# Patient Record
Sex: Male | Born: 1975 | Race: White | Hispanic: Yes | Marital: Single | Smoking: Never smoker
Health system: Southern US, Community
[De-identification: ages and names within clinical notes are randomized; demographics above are authoritative.]

## PROBLEM LIST (undated history)

## (undated) HISTORY — PX: FOOT SURGERY: SHX648

---

## 2014-09-10 ENCOUNTER — Ambulatory Visit (INDEPENDENT_AMBULATORY_CARE_PROVIDER_SITE_OTHER): Payer: Self-pay | Admitting: Surgery

## 2014-10-04 ENCOUNTER — Ambulatory Visit (INDEPENDENT_AMBULATORY_CARE_PROVIDER_SITE_OTHER): Payer: Self-pay | Admitting: Surgery

## 2015-02-10 ENCOUNTER — Encounter (HOSPITAL_COMMUNITY): Payer: Self-pay

## 2015-02-10 ENCOUNTER — Emergency Department (HOSPITAL_COMMUNITY)
Admission: EM | Admit: 2015-02-10 | Discharge: 2015-02-10 | Disposition: A | Discharge status: Home / Self Care | Payer: Self-pay | Attending: Emergency Medicine | Admitting: Emergency Medicine

## 2015-02-10 DIAGNOSIS — K645 Perianal venous thrombosis: Secondary | ICD-10-CM | POA: Insufficient documentation

## 2015-02-10 DIAGNOSIS — K644 Residual hemorrhoidal skin tags: Secondary | ICD-10-CM

## 2015-02-10 LAB — COMPREHENSIVE METABOLIC PANEL
ALT: 19 U/L (ref 0–53)
AST: 24 U/L (ref 0–37)
Albumin: 4.2 g/dL (ref 3.5–5.2)
Alkaline Phosphatase: 101 U/L (ref 39–117)
Anion gap: 4 — ABNORMAL LOW (ref 5–15)
BILIRUBIN TOTAL: 0.9 mg/dL (ref 0.3–1.2)
BUN: 15 mg/dL (ref 6–23)
CO2: 27 mmol/L (ref 19–32)
CREATININE: 1.01 mg/dL (ref 0.50–1.35)
Calcium: 8.8 mg/dL (ref 8.4–10.5)
Chloride: 106 mmol/L (ref 96–112)
GFR calc Af Amer: >90 mL/min (ref >90)
GFR calc non Af Amer: >90 mL/min (ref >90)
Glucose, Bld: 110 mg/dL — ABNORMAL HIGH (ref 70–99)
POTASSIUM: 3.4 mmol/L — AB (ref 3.5–5.1)
Sodium: 137 mmol/L (ref 135–145)
Total Protein: 7.3 g/dL (ref 6.0–8.3)

## 2015-02-10 LAB — CBC
HCT: 44.4 % (ref 39.0–52.0)
HEMOGLOBIN: 15.1 g/dL (ref 13.0–17.0)
MCH: 29.2 pg (ref 26.0–34.0)
MCHC: 34 g/dL (ref 30.0–36.0)
MCV: 85.9 fL (ref 78.0–100.0)
Platelets: 270 10*3/uL (ref 150–400)
RBC: 5.17 MIL/uL (ref 4.22–5.81)
RDW: 12.6 % (ref 11.5–15.5)
WBC: 6.8 10*3/uL (ref 4.0–10.5)

## 2015-02-10 LAB — LIPASE, BLOOD: LIPASE: 29 U/L (ref 11–59)

## 2015-02-10 MED ORDER — DOCUSATE SODIUM 100 MG PO CAPS: 100.0000 mg | ORAL_CAPSULE | Freq: Two times a day (BID) | ORAL | Status: DC

## 2015-02-10 MED ORDER — HYDROCORTISONE 2.5 % RE CREA: TOPICAL_CREAM | RECTAL | Status: DC

## 2015-02-10 MED ORDER — POLYETHYLENE GLYCOL 3350 17 GM/SCOOP PO POWD: 17.0000 g | Freq: Every day | ORAL | Status: DC

## 2015-02-10 NOTE — ED Provider Notes (Signed)
CSN: [id number]     Arrival date & time 02/10/15  2145 History   First MD Initiated Contact with Patient 02/10/15 2203     Chief Complaint  Patient presents with  . Hemorrhoids  . Abdominal Pain     (Consider location/radiation/quality/duration/timing/severity/associated sxs/prior Treatment) HPI Comments: Patient presents today with a chief complaint of hemorrhoids.  He states that hemorrhoids have been present for the past year, but became more painful a few days ago.  He states that he has seen a Surgeon in the past for the hemorrhoids, but has not had surgery.  He reports associated constipation and states that he has to strain with bowel movements.  He reports occasional bright red blood with bowel movements, but no rectal bleeding in the past week.  He states that he has used hemorrhoid creams in the past and has taken a powder.  He is unsure of the names of the medications.  He reports that he had a burning abdominal pain a few days ago, but denies abdominal pain at this time.  He denies fever, chills, nausea, vomiting, or diarrhea.    The history is provided by the patient. The history is limited by a language barrier. A language interpreter was used Furniture conservator/restorer(Phone interpretor used).    History reviewed. No pertinent past medical history. History reviewed. No pertinent past surgical history. History reviewed. No pertinent family history. History  Substance Use Topics  . Smoking status: Never Smoker   . Smokeless tobacco: Not on file  . Alcohol Use: No    Review of Systems  All other systems reviewed and are negative.     Allergies  Review of patient's allergies indicates not on file.  Home Medications   Prior to Admission medications   Not on File   BP 112/75 mmHg  Pulse 70  Temp(Src) 97.8 F (36.6 C) (Oral)  Resp 20  SpO2 99% Physical Exam  Constitutional: He appears well-developed and well-nourished.  HENT:  Head: Normocephalic and atraumatic.  Mouth/Throat:  Oropharynx is clear and moist.  Neck: Normal range of motion. Neck supple.  Cardiovascular: Normal rate, regular rhythm and normal heart sounds.   Pulmonary/Chest: Effort normal and breath sounds normal.  Abdominal: Soft. Bowel sounds are normal. He exhibits no distension and no mass. There is no tenderness. There is no rebound and no guarding.  Genitourinary: Rectal exam shows external hemorrhoid.  One non thrombosed external hemorrhoids.  One very small thrombosed external hemorrhoid.  No surrounding erythema, edema, or warmth.  No gross rectal bleeding.  Musculoskeletal: Normal range of motion.  Neurological: He is alert.  Skin: Skin is warm and dry.  Psychiatric: He has a normal mood and affect.  Nursing note and vitals reviewed.   ED Course  Procedures (including critical care time) Labs Review Labs Reviewed  CBC  COMPREHENSIVE METABOLIC PANEL  LIPASE, BLOOD    Imaging Review No results found.   EKG Interpretation None      MDM   Final diagnoses:  None   Patient presents today with a chief complaint of hemorrhoids.  He reports seeing several providers for this in the past including Surgery.  On exam, he has External Hemorrhoids.  No significantly thrombosed hemorrhoid at this time.  Abdomen is soft and non tender.  Feel that the patient is stable for discharge.  Return precautions given.      Santiago GladHeather Olander Friedl, PA-C 02/10/15 2353  Joya Gaskinsonald W Wickline, MD 02/11/15 [phone]

## 2015-02-10 NOTE — ED Notes (Signed)
Pt also reporting abdominal pain. Sts it is burning in nature.  Sts he normally has to drink cold water to make the burning stop.

## 2015-02-10 NOTE — ED Notes (Signed)
Assessment done with translator phone.

## 2015-02-10 NOTE — ED Notes (Signed)
Pt reporting hemorroids.  Sts they are started to hurt him.  Denies any rectal bleeding.  Also reporting back pain.

## 2017-08-30 ENCOUNTER — Encounter (HOSPITAL_COMMUNITY): Payer: Self-pay | Admitting: Emergency Medicine

## 2017-08-30 ENCOUNTER — Encounter: Payer: Self-pay | Admitting: Family Medicine

## 2017-08-30 ENCOUNTER — Ambulatory Visit (INDEPENDENT_AMBULATORY_CARE_PROVIDER_SITE_OTHER): Payer: Self-pay | Admitting: Family Medicine

## 2017-08-30 VITALS — BP 130/85 | HR 75 | Temp 98.2°F | Resp 18 | Wt 191.2 lb

## 2017-08-30 DIAGNOSIS — M545 Low back pain, unspecified: Secondary | ICD-10-CM

## 2017-08-30 DIAGNOSIS — R1031 Right lower quadrant pain: Secondary | ICD-10-CM

## 2017-08-30 DIAGNOSIS — D72829 Elevated white blood cell count, unspecified: Secondary | ICD-10-CM

## 2017-08-30 DIAGNOSIS — K353 Acute appendicitis with localized peritonitis: Principal | ICD-10-CM | POA: Insufficient documentation

## 2017-08-30 LAB — POCT CBC
Granulocyte percent: 87.4 %G — AB (ref 37–80)
HCT, POC: 44.7 % (ref 43.5–53.7)
Hemoglobin: 15.5 g/dL (ref 14.1–18.1)
LYMPH, POC: 1.3 (ref 0.6–3.4)
MCH, POC: 29.6 pg (ref 27–31.2)
MCHC: 34.6 g/dL (ref 31.8–35.4)
MCV: 85.5 fL (ref 80–97)
MID (cbc): 0.3 (ref 0–0.9)
MPV: 6.7 fL (ref 0–99.8)
POC Granulocyte: 10.7 — AB (ref 2–6.9)
POC LYMPH %: 10.3 % (ref 10–50)
POC MID %: 2.3 % (ref 0–12)
Platelet Count, POC: 273 10*3/uL (ref 142–424)
RBC: 5.23 M/uL (ref 4.69–6.13)
RDW, POC: 12.9 %
WBC: 12.2 10*3/uL — AB (ref 4.6–10.2)

## 2017-08-30 LAB — POCT URINALYSIS DIP (MANUAL ENTRY)
BILIRUBIN UA: NEGATIVE
Blood, UA: NEGATIVE
GLUCOSE UA: NEGATIVE mg/dL
Ketones, POC UA: NEGATIVE mg/dL
LEUKOCYTES UA: NEGATIVE
NITRITE UA: NEGATIVE
PH UA: 7 (ref 5.0–8.0)
Protein Ur, POC: NEGATIVE mg/dL
Spec Grav, UA: 1.02 (ref 1.010–1.025)
Urobilinogen, UA: 0.2 E.U./dL

## 2017-08-30 LAB — POC MICROSCOPIC URINALYSIS (UMFC): MUCUS RE: ABSENT

## 2017-08-30 NOTE — Progress Notes (Signed)
Subjective:  By signing my name below, I, George Wyatt, attest that this documentation has been prepared under the direction and in the presence of George FloodJeffrey R Jujuan Dugo, MD Electronically Signed: Charline BillsEssence Wyatt, ED Scribe 08/30/2017 at 3:28 PM.   Patient ID: George Wyatt, male    DOB: Oct 22, 1976, 41 y.o.   MRN: [id number]  Chief Complaint  Patient presents with  . Abdominal Pain    started today; hurts more on right side  . Back Pain    x4-6 months  . Diarrhea    once this morning; denies vomiting   HPI George Crazedgar Suhre is a 41 y.o. male who presents to Primary Care at [facility]. New pt to our office with multiple concerns but primary issue of abdominal pain and diarrhea. Also reports 4-6 month h/o back pain. Pt states that he stood to urinate around 5 AM but noticed sudden onset of right abdominal pain which has worsened as the day has progressed. No sick contacts at home. No treatments tried PTA. Pt denies fever, vomiting, diarrhea, constipation, difficulty urinating, dysuria, hematuria, h/o similar abdominal pain, h/o abdominal surgeries, h/o kidney stones.   Back Pain Pt points towards right low back and toward CVA. States he has been experiencing right low back pain for the past 4-6 months, however, pain has been increasing over the past month. Denies bladder/bowel incontinence, saddle anesthesia, weakness in lower extremities, injury.  There are no active problems to display for this patient.  No past medical history on file. No past surgical history on file. No Known Allergies Prior to Admission medications   Not on File   Social History   Social History  . Marital status: Single    Spouse name: N/A  . Number of children: N/A  . Years of education: N/A   Occupational History  . Not on file.   Social History Main Topics  . Smoking status: Never Smoker  . Smokeless tobacco: Never Used  . Alcohol use No  . Drug use: Unknown  . Sexual activity: Not on file   Other Topics  Concern  . Not on file   Social History Narrative  . No narrative on file   Review of Systems  Constitutional: Negative for fever.  Gastrointestinal: Positive for abdominal pain. Negative for constipation, diarrhea and vomiting.  Genitourinary: Negative for difficulty urinating, dysuria, enuresis and hematuria.  Musculoskeletal: Positive for back pain.  Neurological: Negative for weakness and numbness.      Objective:   Physical Exam  Constitutional: He is oriented to person, place, and time. He appears well-developed and well-nourished.  HENT:  Head: Normocephalic and atraumatic.  Eyes: Pupils are equal, round, and reactive to light. EOM are normal.  Neck: No JVD present. Carotid bruit is not present.  Cardiovascular: Normal rate, regular rhythm and normal heart sounds.   No murmur heard. Pulmonary/Chest: Effort normal and breath sounds normal. He has no rales.  Abdominal: Bowel sounds are increased. There is tenderness in the right upper quadrant, right lower quadrant and suprapubic area. There is guarding (somewhat in RLQ) and tenderness at McBurney's point. There is negative Murphy's sign.  Slightly hyperactive. Minimal suprapubic tenderness, primarily at RLQ. Also having some RUQ tenderness. Negative heel jar.  Musculoskeletal: He exhibits no edema.  Negative SLR. Slight tenderness over R CVA and R paraspinals. Able to heel and toe walk without difficulty. Abdominal pain with forward flexion but otherwise intact.  Neurological: He is alert and oriented to person, place, and time.  Skin: Skin  is warm and dry.  Psychiatric: He has a normal mood and affect.  Vitals reviewed.  Vitals:   08/30/17 1451  BP: 130/85  Pulse: 75  Resp: 18  Temp: 98.2 F (36.8 C)  TempSrc: Oral  SpO2: 100%  Weight: 191 lb 3.2 oz (86.7 kg)   Results for orders placed or performed in visit on 08/30/17  POCT urinalysis dipstick  Result Value Ref Range   Color, UA yellow yellow   Clarity, UA  clear clear   Glucose, UA negative negative mg/dL   Bilirubin, UA negative negative   Ketones, POC UA negative negative mg/dL   Spec Grav, UA 1.610 9.604 - 1.025   Blood, UA negative negative   pH, UA 7.0 5.0 - 8.0   Protein Ur, POC negative negative mg/dL   Urobilinogen, UA 0.2 0.2 or 1.0 E.U./dL   Nitrite, UA Negative Negative   Leukocytes, UA Negative Negative  POCT Microscopic Urinalysis ([facility])  Result Value Ref Range   WBC,UR,HPF,POC None None WBC/hpf   RBC,UR,HPF,POC None None RBC/hpf   Bacteria None None, Too numerous to count   Mucus Absent Absent   Epithelial Cells, UR Per Microscopy Few (A) None, Too numerous to count cells/hpf  POCT CBC  Result Value Ref Range   WBC 12.2 (A) 4.6 - 10.2 K/uL   Lymph, poc 1.3 0.6 - 3.4   POC LYMPH PERCENT 10.3 10 - 50 %L   MID (cbc) 0.3 0 - 0.9   POC MID % 2.3 0 - 12 %M   POC Granulocyte 10.7 (A) 2 - 6.9   Granulocyte percent 87.4 (A) 37 - 80 %G   RBC 5.23 4.69 - 6.13 M/uL   Hemoglobin 15.5 14.1 - 18.1 g/dL   HCT, POC 54.0 98.1 - 53.7 %   MCV 85.5 80 - 97 fL   MCH, POC 29.6 27 - 31.2 pg   MCHC 34.6 31.8 - 35.4 g/dL   RDW, POC 19.1 %   Platelet Count, POC 273 142 - 424 K/uL   MPV 6.7 0 - 99.8 fL      Assessment & Plan:  George Wyatt is a 41 y.o. male RLQ abdominal pain - Plan: POCT urinalysis dipstick, POCT Microscopic Urinalysis ([facility]), POCT CBC, CT Abdomen Pelvis W Contrast  Right-sided low back pain without sciatica, unspecified chronicity - Plan: POCT urinalysis dipstick, POCT Microscopic Urinalysis ([facility]), POCT CBC  Leukocytosis, unspecified type - Plan: CT Abdomen Pelvis W Contrast  RLQ abd pain, present since early am, positive Mcburney's point, normal U/A, with leukocytosis. Possible early appendicitis.   - NPO, check CT abdomen/pelvis to determine if ER eval/gen surgery eval needed.   No orders of the defined types were placed in this encounter.  Patient Instructions   Please go directly to [facility]  following your visit today. You will arrive at the main entrance and will be guided to the radiology department. You will begin to drink contrast when you arrive and will be scanned 2 hours later. Please expect to stay after your exam so that Dr. Neva Seat may read the results.   I will let you know about the CT scan results once I receive them. Remain at the hospital until you have heard the results as you may possibly have appendicitis and may need to be admitted. Nothing to eat or drink other than the contrast for now.       IF you received an x-ray today, you will receive an invoice from [facility]. Please  contact [facility] at [phone] with questions or concerns regarding your invoice.   IF you received labwork today, you will receive an invoice from [employer]. Please contact [facility] at [phone] with questions or concerns regarding your invoice.   Our billing staff will not be able to assist you with questions regarding bills from these companies.  You will be contacted with the lab results as soon as they are available. The fastest way to get your results is to activate your My Chart account. Instructions are located on the last page of this paperwork. If you have not heard from [place] regarding the results in 2 weeks, please contact this office.       I personally performed the services described in this documentation, which was scribed in my presence. The recorded information has been reviewed and considered for accuracy and completeness, addended by me as needed, and agree with information above.  Signed,   Meredith Staggers, MD Primary Care at [facility].  08/30/17 10:16 PM

## 2017-08-30 NOTE — Patient Instructions (Addendum)
Please go directly to [facility] following your visit today. You will arrive at the main entrance and will be guided to the radiology department. You will begin to drink contrast when you arrive and will be scanned 2 hours later. Please expect to stay after your exam so that Dr. Neva SeatGreene may read the results.   I will let you know about the CT scan results once I receive them. Remain at the hospital until you have heard the results as you may possibly have appendicitis and may need to be admitted. Nothing to eat or drink other than the contrast for now.       IF you received an x-ray today, you will receive an invoice from [facility]. Please contact [facility] at [phone] with questions or concerns regarding your invoice.   IF you received labwork today, you will receive an invoice from [facility]. Please contact [facility] at [phone] with questions or concerns regarding your invoice.   Our billing staff will not be able to assist you with questions regarding bills from these companies.  You will be contacted with the lab results as soon as they are available. The fastest way to get your results is to activate your My Chart account. Instructions are located on the last page of this paperwork. If you have not heard from us regarding the results in 2 weeks, please contact this office.

## 2017-08-30 NOTE — ED Notes (Signed)
Pt. Had labs at [facility]. Copy of labs with pt.

## 2017-08-30 NOTE — ED Triage Notes (Signed)
Pt. Stated, I have stomach pain on the right more and my back hurts bad for about a month.

## 2017-08-31 ENCOUNTER — Observation Stay (HOSPITAL_COMMUNITY): Payer: Self-pay | Admitting: Anesthesiology

## 2017-08-31 ENCOUNTER — Encounter (HOSPITAL_COMMUNITY)
Admission: EM | Disposition: A | Discharge status: Home / Self Care | Payer: Self-pay | Source: Home / Self Care | Attending: Emergency Medicine

## 2017-08-31 ENCOUNTER — Observation Stay (HOSPITAL_COMMUNITY)
Admission: EM | Admit: 2017-08-31 | Discharge: 2017-09-01 | Disposition: A | Discharge status: Home / Self Care | Payer: Self-pay | Attending: General Surgery | Admitting: General Surgery

## 2017-08-31 ENCOUNTER — Ambulatory Visit (HOSPITAL_COMMUNITY): Admission: RE | Admit: 2017-08-31 | Payer: Self-pay | Source: Ambulatory Visit

## 2017-08-31 ENCOUNTER — Encounter (HOSPITAL_COMMUNITY): Payer: Self-pay | Admitting: Anesthesiology

## 2017-08-31 ENCOUNTER — Emergency Department (HOSPITAL_COMMUNITY): Payer: Self-pay

## 2017-08-31 DIAGNOSIS — K353 Acute appendicitis with localized peritonitis, without perforation or gangrene: Secondary | ICD-10-CM

## 2017-08-31 DIAGNOSIS — K3580 Unspecified acute appendicitis: Secondary | ICD-10-CM | POA: Diagnosis present

## 2017-08-31 HISTORY — PX: LAPAROSCOPIC APPENDECTOMY: SHX408

## 2017-08-31 LAB — COMPREHENSIVE METABOLIC PANEL
ALT: 18 U/L (ref 17–63)
AST: 21 U/L (ref 15–41)
Albumin: 3.9 g/dL (ref 3.5–5.0)
Alkaline Phosphatase: 86 U/L (ref 38–126)
Anion gap: 10 (ref 5–15)
BUN: 10 mg/dL (ref 6–20)
CO2: 24 mmol/L (ref 22–32)
Calcium: 8.8 mg/dL — ABNORMAL LOW (ref 8.9–10.3)
Chloride: 102 mmol/L (ref 101–111)
Creatinine, Ser: 0.97 mg/dL (ref 0.61–1.24)
GFR calc Af Amer: >60 mL/min (ref >60)
GFR calc non Af Amer: >60 mL/min (ref >60)
Glucose, Bld: 105 mg/dL — ABNORMAL HIGH (ref 65–99)
Potassium: 3.8 mmol/L (ref 3.5–5.1)
Sodium: 136 mmol/L (ref 135–145)
Total Bilirubin: 2.6 mg/dL — ABNORMAL HIGH (ref 0.3–1.2)
Total Protein: 7.2 g/dL (ref 6.5–8.1)

## 2017-08-31 LAB — LIPASE, BLOOD: Lipase: 30 U/L (ref 11–51)

## 2017-08-31 LAB — SURGICAL PCR SCREEN
MRSA, PCR: NEGATIVE
Staphylococcus aureus: NEGATIVE

## 2017-08-31 LAB — HIV ANTIBODY (ROUTINE TESTING W REFLEX): HIV Screen 4th Generation wRfx: NONREACTIVE

## 2017-08-31 SURGERY — APPENDECTOMY, LAPAROSCOPIC
Anesthesia: General | Site: Abdomen

## 2017-08-31 MED ORDER — MIDAZOLAM HCL 2 MG/2ML IJ SOLN
INTRAMUSCULAR | Status: AC
  Filled 2017-08-31: qty 2

## 2017-08-31 MED ORDER — SODIUM CHLORIDE 0.9 % IV BOLUS (SEPSIS)
1000.0000 mL | Freq: Once | INTRAVENOUS | Status: AC
  Administered 2017-08-31: 1000 mL via INTRAVENOUS

## 2017-08-31 MED ORDER — FENTANYL CITRATE (PF) 250 MCG/5ML IJ SOLN
INTRAMUSCULAR | Status: AC
  Filled 2017-08-31: qty 5

## 2017-08-31 MED ORDER — SUGAMMADEX SODIUM 200 MG/2ML IV SOLN
INTRAVENOUS | Status: AC
  Filled 2017-08-31: qty 2

## 2017-08-31 MED ORDER — LIDOCAINE 2% (20 MG/ML) 5 ML SYRINGE
INTRAMUSCULAR | Status: DC | PRN
  Administered 2017-08-31: 60 mg via INTRAVENOUS

## 2017-08-31 MED ORDER — MIDAZOLAM HCL 5 MG/5ML IJ SOLN
INTRAMUSCULAR | Status: DC | PRN
  Administered 2017-08-31: 2 mg via INTRAVENOUS

## 2017-08-31 MED ORDER — BUPIVACAINE-EPINEPHRINE (PF) 0.25% -1:200000 IJ SOLN
INTRAMUSCULAR | Status: AC
  Filled 2017-08-31: qty 30

## 2017-08-31 MED ORDER — BUPIVACAINE-EPINEPHRINE 0.25% -1:200000 IJ SOLN
INTRAMUSCULAR | Status: DC | PRN
  Administered 2017-08-31: 16 mL

## 2017-08-31 MED ORDER — LIDOCAINE 2% (20 MG/ML) 5 ML SYRINGE
INTRAMUSCULAR | Status: AC
  Filled 2017-08-31: qty 5

## 2017-08-31 MED ORDER — SUGAMMADEX SODIUM 200 MG/2ML IV SOLN
INTRAVENOUS | Status: DC | PRN
  Administered 2017-08-31: 200 mg via INTRAVENOUS

## 2017-08-31 MED ORDER — DEXAMETHASONE SODIUM PHOSPHATE 4 MG/ML IJ SOLN
INTRAMUSCULAR | Status: DC | PRN
  Administered 2017-08-31: 10 mg via INTRAVENOUS

## 2017-08-31 MED ORDER — HYDROMORPHONE HCL 1 MG/ML IJ SOLN
0.2500 mg | INTRAMUSCULAR | Status: DC | PRN
  Administered 2017-08-31 (×2): 0.5 mg via INTRAVENOUS

## 2017-08-31 MED ORDER — ROCURONIUM BROMIDE 10 MG/ML (PF) SYRINGE
PREFILLED_SYRINGE | INTRAVENOUS | Status: AC
  Filled 2017-08-31: qty 5

## 2017-08-31 MED ORDER — HYDROCODONE-ACETAMINOPHEN 5-325 MG PO TABS: 1.0000 | ORAL_TABLET | ORAL | Status: DC | PRN

## 2017-08-31 MED ORDER — ONDANSETRON HCL 4 MG/2ML IJ SOLN: 4.0000 mg | Freq: Once | INTRAMUSCULAR | Status: DC | PRN

## 2017-08-31 MED ORDER — ROCURONIUM BROMIDE 100 MG/10ML IV SOLN
INTRAVENOUS | Status: DC | PRN
  Administered 2017-08-31: 40 mg via INTRAVENOUS

## 2017-08-31 MED ORDER — SODIUM CHLORIDE 0.9 % IR SOLN
Status: DC | PRN
Start: 2017-08-31 — End: 2017-08-31
  Administered 2017-08-31: 1000 mL

## 2017-08-31 MED ORDER — DEXTROSE-NACL 5-0.9 % IV SOLN
INTRAVENOUS | Status: DC
  Administered 2017-08-31: 1000 mL via INTRAVENOUS
  Administered 2017-08-31 – 2017-09-01 (×2): via INTRAVENOUS

## 2017-08-31 MED ORDER — 0.9 % SODIUM CHLORIDE (POUR BTL) OPTIME
TOPICAL | Status: DC | PRN
  Administered 2017-08-31: 1000 mL

## 2017-08-31 MED ORDER — ONDANSETRON 4 MG PO TBDP: 4.0000 mg | ORAL_TABLET | Freq: Four times a day (QID) | ORAL | Status: DC | PRN

## 2017-08-31 MED ORDER — PROPOFOL 10 MG/ML IV BOLUS
INTRAVENOUS | Status: AC
  Filled 2017-08-31: qty 20

## 2017-08-31 MED ORDER — HYDROMORPHONE HCL 1 MG/ML IJ SOLN
INTRAMUSCULAR | Status: AC
  Administered 2017-08-31: 0.5 mg via INTRAVENOUS
  Filled 2017-08-31: qty 1

## 2017-08-31 MED ORDER — LACTATED RINGERS IV SOLN
INTRAVENOUS | Status: DC
  Administered 2017-08-31 (×2): via INTRAVENOUS

## 2017-08-31 MED ORDER — METRONIDAZOLE IN NACL 5-0.79 MG/ML-% IV SOLN
500.0000 mg | Freq: Once | INTRAVENOUS | Status: AC
  Administered 2017-08-31: 500 mg via INTRAVENOUS
  Filled 2017-08-31: qty 100

## 2017-08-31 MED ORDER — IOPAMIDOL (ISOVUE-300) INJECTION 61%
INTRAVENOUS | Status: AC
  Administered 2017-08-31: 100 mL
  Filled 2017-08-31: qty 100

## 2017-08-31 MED ORDER — MEPERIDINE HCL 25 MG/ML IJ SOLN: 6.2500 mg | INTRAMUSCULAR | Status: DC | PRN

## 2017-08-31 MED ORDER — FENTANYL CITRATE (PF) 100 MCG/2ML IJ SOLN
INTRAMUSCULAR | Status: DC | PRN
  Administered 2017-08-31 (×3): 50 ug via INTRAVENOUS

## 2017-08-31 MED ORDER — DEXTROSE 5 % IV SOLN
2.0000 g | Freq: Once | INTRAVENOUS | Status: AC
  Administered 2017-08-31: 2 g via INTRAVENOUS
  Filled 2017-08-31: qty 2

## 2017-08-31 MED ORDER — HYDROMORPHONE HCL 1 MG/ML IJ SOLN: 1.0000 mg | INTRAMUSCULAR | Status: DC | PRN

## 2017-08-31 MED ORDER — ONDANSETRON HCL 4 MG/2ML IJ SOLN
4.0000 mg | Freq: Four times a day (QID) | INTRAMUSCULAR | Status: DC | PRN
  Administered 2017-08-31: 4 mg via INTRAVENOUS

## 2017-08-31 MED ORDER — SUCCINYLCHOLINE CHLORIDE 20 MG/ML IJ SOLN
INTRAMUSCULAR | Status: DC | PRN
  Administered 2017-08-31: 140 mg via INTRAVENOUS

## 2017-08-31 SURGICAL SUPPLY — 39 items
APPLIER CLIP ROT 10 11.4 M/L (STAPLE)
BLADE CLIPPER SURG (BLADE) IMPLANT
CANISTER SUCT 3000ML PPV (MISCELLANEOUS) ×3 IMPLANT
CHLORAPREP W/TINT 26ML (MISCELLANEOUS) ×3 IMPLANT
CLIP APPLIE ROT 10 11.4 M/L (STAPLE) IMPLANT
COVER SURGICAL LIGHT HANDLE (MISCELLANEOUS) ×3 IMPLANT
CUTTER FLEX LINEAR 45M (STAPLE) ×3 IMPLANT
DERMABOND ADVANCED (GAUZE/BANDAGES/DRESSINGS) ×2
DERMABOND ADVANCED .7 DNX12 (GAUZE/BANDAGES/DRESSINGS) ×1 IMPLANT
ELECT REM PT RETURN 9FT ADLT (ELECTROSURGICAL) ×3
ELECTRODE REM PT RTRN 9FT ADLT (ELECTROSURGICAL) ×1 IMPLANT
ENDOLOOP SUT PDS II  0 18 (SUTURE)
ENDOLOOP SUT PDS II 0 18 (SUTURE) IMPLANT
GLOVE BIO SURGEON STRL SZ7 (GLOVE) ×3 IMPLANT
GLOVE BIO SURGEON STRL SZ7.5 (GLOVE) ×3 IMPLANT
GLOVE BIOGEL PI IND STRL 6.5 (GLOVE) ×1 IMPLANT
GLOVE BIOGEL PI IND STRL 7.0 (GLOVE) ×3 IMPLANT
GLOVE BIOGEL PI INDICATOR 6.5 (GLOVE) ×2
GLOVE BIOGEL PI INDICATOR 7.0 (GLOVE) ×6
GLOVE SURG SS PI 6.5 STRL IVOR (GLOVE) ×3 IMPLANT
GOWN STRL REUS W/ TWL LRG LVL3 (GOWN DISPOSABLE) ×3 IMPLANT
GOWN STRL REUS W/TWL LRG LVL3 (GOWN DISPOSABLE) ×6
KIT BASIN OR (CUSTOM PROCEDURE TRAY) ×3 IMPLANT
KIT ROOM TURNOVER OR (KITS) ×3 IMPLANT
NS IRRIG 1000ML POUR BTL (IV SOLUTION) ×3 IMPLANT
PAD ARMBOARD 7.5X6 YLW CONV (MISCELLANEOUS) ×6 IMPLANT
POUCH SPECIMEN RETRIEVAL 10MM (ENDOMECHANICALS) ×3 IMPLANT
RELOAD STAPLE TA45 3.5 REG BLU (ENDOMECHANICALS) ×3 IMPLANT
SET IRRIG TUBING LAPAROSCOPIC (IRRIGATION / IRRIGATOR) ×3 IMPLANT
SHEARS HARMONIC ACE PLUS 36CM (ENDOMECHANICALS) ×3 IMPLANT
SPECIMEN JAR SMALL (MISCELLANEOUS) ×3 IMPLANT
SUT MNCRL AB 4-0 PS2 18 (SUTURE) ×3 IMPLANT
TOWEL OR 17X24 6PK STRL BLUE (TOWEL DISPOSABLE) ×3 IMPLANT
TOWEL OR 17X26 10 PK STRL BLUE (TOWEL DISPOSABLE) ×3 IMPLANT
TRAY FOLEY CATH SILVER 16FR (SET/KITS/TRAYS/PACK) ×3 IMPLANT
TRAY LAPAROSCOPIC MC (CUSTOM PROCEDURE TRAY) ×3 IMPLANT
TROCAR XCEL BLUNT TIP 100MML (ENDOMECHANICALS) ×3 IMPLANT
TROCAR XCEL NON-BLD 5MMX100MML (ENDOMECHANICALS) ×6 IMPLANT
TUBING INSUFFLATION (TUBING) ×3 IMPLANT

## 2017-08-31 NOTE — Anesthesia Preprocedure Evaluation (Addendum)
Anesthesia Evaluation  Patient identified by MRN, date of birth, ID band Patient awake    Reviewed: Allergy & Precautions, NPO status   Airway Mallampati: I  TM Distance: >3 FB Neck ROM: Full    Dental no notable dental hx. (+) Teeth Intact   Pulmonary neg pulmonary ROS,    Pulmonary exam normal breath sounds clear to auscultation       Cardiovascular Exercise Tolerance: Good negative cardio ROS Normal cardiovascular exam Rhythm:Regular Rate:Normal     Neuro/Psych negative neurological ROS     GI/Hepatic negative GI ROS, Neg liver ROS,   Endo/Other  negative endocrine ROS  Renal/GU negative Renal ROS  negative genitourinary   Musculoskeletal negative musculoskeletal ROS (+)   Abdominal Normal abdominal exam  (+)   Peds  Hematology negative hematology ROS (+)   Anesthesia Other Findings Acute appendicitis   Reproductive/Obstetrics                            Anesthesia Physical Anesthesia Plan  ASA: II  Anesthesia Plan: General   Post-op Pain Management:    Induction: Intravenous, Rapid sequence and Cricoid pressure planned  PONV Risk Score and Plan: 2 and Ondansetron and Dexamethasone  Airway Management Planned: Oral ETT  Additional Equipment:   Intra-op Plan:   Post-operative Plan: Extubation in OR  Informed Consent: I have reviewed the patients History and Physical, chart, labs and discussed the procedure including the risks, benefits and alternatives for the proposed anesthesia with the patient or authorized representative who has indicated his/her understanding and acceptance.   Dental Advisory Given  Plan Discussed with: CRNA and Surgeon  Anesthesia Plan Comments:        Anesthesia Quick Evaluation

## 2017-08-31 NOTE — ED Provider Notes (Signed)
[facility]-EMERGENCY DEPT Provider Note   CSN: [id number] Arrival date & time: 08/30/17  1745     History   Chief Complaint Chief Complaint  Patient presents with  . Abdominal Pain  . Back Pain    HPI George Wyatt is a 41 y.o. male.  HPI Patient presents to the emergency department with our abdominal pain that started earlier this morning.  The patient states that he was seen at the urgent care and they sent him to the emergency department for further evaluation.  He had an elevated white blood cell count.  The patient states that he did not take any medications prior to arrival.  He states he does have some pain in his lower back on the right as well.  The patient states he has had no fever, nausea, vomiting, weakness, dizziness, headache, blurred vision, chest pain, shortness of breath, diarrhea, bloody stools, hematemesis, or syncope.  History reviewed. No pertinent past medical history.  There are no active problems to display for this patient.   History reviewed. No pertinent surgical history.     Home Medications    Prior to Admission medications   Not on File    Family History No family history on file.  Social History Social History  Substance Use Topics  . Smoking status: Never Smoker  . Smokeless tobacco: Never Used  . Alcohol use Yes     Allergies   Patient has no known allergies.   Review of Systems Review of Systems All other systems negative except as documented in the HPI. All pertinent positives and negatives as reviewed in the HPI. Physical Exam Updated Vital Signs BP 111/79   Pulse 90   Temp 99.2 F (37.3 C) (Oral)   Resp 17   SpO2 99%   Physical Exam  Constitutional: He is oriented to person, place, and time. He appears well-developed and well-nourished. No distress.  HENT:  Head: Normocephalic and atraumatic.  Mouth/Throat: Oropharynx is clear and moist.  Eyes: Pupils are equal, round, and reactive to light.  Neck: Normal range of  motion. Neck supple.  Cardiovascular: Normal rate, regular rhythm and normal heart sounds.  Exam reveals no gallop and no friction rub.   No murmur heard. Pulmonary/Chest: Effort normal and breath sounds normal. No respiratory distress. He has no wheezes.  Abdominal: Soft. Normal appearance and bowel sounds are normal. He exhibits no distension. There is no hepatosplenomegaly. There is tenderness in the right lower quadrant. There is rebound and guarding.    Neurological: He is alert and oriented to person, place, and time. He exhibits normal muscle tone. Coordination normal.  Skin: Skin is warm and dry. Capillary refill takes less than 2 seconds. No rash noted. No erythema.  Psychiatric: He has a normal mood and affect. His behavior is normal.  Nursing note and vitals reviewed.    ED Treatments / Results  Labs (all labs ordered are listed, but only abnormal results are displayed) Labs Reviewed  COMPREHENSIVE METABOLIC PANEL - Abnormal; Notable for the following:       Result Value   Glucose, Bld 105 (*)    Calcium 8.8 (*)    Total Bilirubin 2.6 (*)    All other components within normal limits  LIPASE, BLOOD    EKG  EKG Interpretation None       Radiology Ct Abdomen Pelvis W Contrast  Result Date: 08/31/2017 CLINICAL DATA:  Right-sided abdominal pain.  Abdominal infection. EXAM: CT ABDOMEN AND PELVIS WITH CONTRAST TECHNIQUE: Multidetector CT  imaging of the abdomen and pelvis was performed using the standard protocol following bolus administration of intravenous contrast. CONTRAST:  ISOVUE-300 IOPAMIDOL (ISOVUE-300) INJECTION 61% COMPARISON:  None. FINDINGS: Lower chest: Motion artifact at the lung bases. Dependent atelectasis. Heart size is normal. Hepatobiliary: No focal liver abnormality is seen. No gallstones, gallbladder wall thickening, or biliary dilatation. Pancreas: No ductal dilatation or inflammation. Spleen: Normal in size without focal abnormality.  Adrenals/Urinary Tract: Normal adrenal glands. No hydronephrosis or perinephric edema. Simple cyst in the mid right kidney measures 4.3 cm. Symmetric excretion on delayed phase imaging. Urinary bladder is nondistended. Stomach/Bowel: The appendix is dilated measuring 9 mm, thick walled with intraluminal fluid. There is surrounding periappendiceal soft tissue stranding and small amount of free fluid in the right lower quadrant. No abscess or perforation. Stomach, small and large bowel are unremarkable. Vascular/Lymphatic: Small right lower quadrant lymph nodes, likely reactive. Normal caliber abdominal aorta. Accessory left renal artery is incidentally noted. Portal and superior mesenteric veins are patent. Reproductive: Prostate is unremarkable. Mild cystic change to the seminal vesicles. Other: Small fat containing umbilical hernia. Small free fluid in the right lower quadrant, no intra-abdominal abscess. No free air. Musculoskeletal: There are no acute or suspicious osseous abnormalities. IMPRESSION: Uncomplicated acute appendicitis. Electronically Signed   By: Rubye Oaks M.D.   On: 08/31/2017 03:29    Procedures Procedures (including critical care time)  Medications Ordered in ED Medications  cefTRIAXone (ROCEPHIN) 2 g in dextrose 5 % 50 mL IVPB (not administered)    And  metroNIDAZOLE (FLAGYL) IVPB 500 mg (not administered)  iopamidol (ISOVUE-300) 61 % injection (100 mLs  Contrast Given 08/31/17 0259)  sodium chloride 0.9 % bolus 1,000 mL (1,000 mLs Intravenous New Bag/Given 08/31/17 0345)     Initial Impression / Assessment and Plan / ED Course  I have reviewed the triage vital signs and the nursing notes.  Pertinent labs & imaging results that were available during my care of the patient were reviewed by me and considered in my medical decision making (see chart for details).    I spoke with Dr. Derrell Lolling of general surgery.  The patient was started on antibiotics and he will evaluate him  for surgical intervention.   Final Clinical Impressions(s) / ED Diagnoses   Final diagnoses:  Acute appendicitis with localized peritonitis    New Prescriptions New Prescriptions   No medications on file     Charlestine Night, Cordelia Poche 08/31/17 1610    Alvira Monday, MD 09/04/17 1537

## 2017-08-31 NOTE — Discharge Instructions (Signed)
CIRUGIA LAPAROSCOPICA: INSTRUCCIONES DE POST OPERATORIO. ° °Revise siempre los documentos que le entreguen en el lugar donde se ha hecho la cirugia. ° °SI USTED NECESITA DOCUMENTOS DE INCAPACIDAD (DISABLE) O DE PERMISO FAMILAR (FAMILY LEAVE) NECESITA TRAERLOS A LA OFICINA PARA QUE SEAN PROCESADOS. °NO  SE LOS DE A SU DOCTOR. °1. A su alta del hospital se le dara una receta para controlar el dolor. Tomela como ha sido recetada, si la necesita. Si no la necesita puede tomar, Acetaminofen (Tylenol) o Ibuprofen (Advil) para aliviar dolor moderado. °2. Continue tomando el resto de sus medicinas. °3. Si necesita rellenar la receta, llame a la farmacia. ellos contactan a nuestra oficina pidiendo autorizacion. Este tipo de receta no pueden ser rellenadas despues de las  5pm o durante los fines de semana. °4. Con relacion a la dieta: debe ser ligera los primeros dias despues que llege a la casa. Ejemplo: sopas y galleticas. Tome bastante liquido esos dias. °5. La mayoria de los pacientes padecen de inflamacion y cambio de coloracion de la piel alrededor de las incisiones. esto toma dias en resolver.  pnerse una bolsa de hielo en el area affectada ayuda..  °6. Es comun tambien tener un poco de estrenimiento si esta tomado medicinas para el dolor. incremente la cantidad de liquidos a tomar y puede tomar (Colace) esto previene el problema. Si ya tiene estrenimiento, es decir no ha defecado en 48 horas, puede tomar un laxativo (Milk of Magnesia or Miralax) uselo como el paquete le explica. °7.  A menos que se le diga algo diferente. Remueva el bendaje a las 24-48 horas despues dela cirugia. y puede banarse en la ducha sin ningun problema. usted puede tener steri-strips (pequenas curitas transparentes en la piel puesta encima de la incision)  Estas banditas strips should be left on the skin for 7-10 days.   Si su cirujano puso pegamento encima de la incision usted puede banarse bajo la ducha en 24 horas. Este pegamento empezara a  caerse en las proximas 2-3 semanas. Si le pusieron suturas o presillas (grapos) estos seran quitados en su proxima cita en la oficina. . °a. ACTIVIDADES:  Puede hacer actividad ligera.  Como caminar , subir escaleras y poco a poco irlas incrementando tanto como las tolere. Puede tener relaciones sexuales cuando sea comfortable. No carge objetos pesados o haga esfuerzos que no sean aprovados por su doctor. °b. Puede manejar en cuanto no esta tomando medicamentos fuertes (narcoticos) para el dolor, pueda abrochar confortablemente el cinturon de seguridad, y pueda maniobrar y usar los pedales de su vehiculo con seguridad. °c. PUEDE REGRESAR A TRABAJAR  °8. Debe ver a su doctor para una cita de seguimiento en 2-3 semanas despues de la cirugia.  °9. OTRAS ISNSTRUCCIONES:___________________________________________________________________________________ °CUANDO LLAMAR A SU MEDICO: °1. FIEBRE mayor de  101.0 °2. No produccion de orina. °3. Sangramiento continue de la herida °4. Incremento de dolor, enrojecimientio o drenaje de la herida (incision) °5. Incremento de dolor abdominal. ° °The clinic staff is available to answer your questions during regular business hours.  Please don’t hesitate to call and ask to speak to one of the nurses for clinical concerns.  If you have a medical emergency, go to the nearest emergency room or call 911.  A surgeon from [facility] is always on call at the hospital. °[address], [address], [place], [place]  [address] ? [address] [address], [place], [place]   [address] °[phone] ? [phone] ? FAX [phone] °Web site: [email or web address] ° ° °

## 2017-08-31 NOTE — Transfer of Care (Signed)
Immediate Anesthesia Transfer of Care Note  Patient: George Wyatt  Procedure(s) Performed: Procedure(s): APPENDECTOMY LAPAROSCOPIC (N/A)  Patient Location: PACU  Anesthesia Type:General  Level of Consciousness: awake and alert   Airway & Oxygen Therapy: Patient Spontanous Breathing and Patient connected to face mask oxygen  Post-op Assessment: Report given to RN and Post -op Vital signs reviewed and stable  Post vital signs: Reviewed and stable  Last Vitals:  Vitals:   08/31/17 0430 08/31/17 0509  BP: 121/90 115/86  Pulse: 76 78  Resp:  18  Temp:  36.9 C  SpO2: 99% 98%    Last Pain:  Vitals:   08/31/17 0522  TempSrc:   PainSc: 5       Patients Stated Pain Goal: 0 (08/31/17 0522)  Complications: No apparent anesthesia complications

## 2017-08-31 NOTE — Op Note (Signed)
08/31/2017  3:27 PM  PATIENT:  George Wyatt  41 y.o. male  PRE-OPERATIVE DIAGNOSIS:  Acute Appendicitis  POST-OPERATIVE DIAGNOSIS:  Acute Appendicitis  PROCEDURE:  Procedure(s): APPENDECTOMY LAPAROSCOPIC (N/A)  SURGEON:  Surgeon(s) and Role:    Griselda Miner, MD - Primary  PHYSICIAN ASSISTANT:   ASSISTANTS: none   ANESTHESIA:   general  EBL:  Total I/O In: 1000 [I.V.:1000] Out: -   BLOOD ADMINISTERED:none  DRAINS: none   LOCAL MEDICATIONS USED:  MARCAINE     SPECIMEN:  Source of Specimen:  appendix  DISPOSITION OF SPECIMEN:  PATHOLOGY  COUNTS:  YES  TOURNIQUET:  * No tourniquets in log *  DICTATION: .Dragon Dictation   After informed consent was obtained patient was brought to the operating room placed in the supine position on the operating room table. After adequate induction of general anesthesia the patient's abdomen was prepped with ChloraPrep, allowed to dry, and draped in usual sterile manner. An appropriate timeout was performed. The area below the umbilicus was infiltrated with quarter percent Marcaine. A small incision was made with a 15 blade knife. This incision was carried down through the subcutaneous tissue bluntly with a hemostat and Army-Nav[employer] retractors until the linea alba was identified. The linea alba was incised with a 15 blade knife. Each side was grasped Coker clamps and elevated anteriorly. The preperitoneal space was probed bluntly with a hemostat until the peritoneum was opened and access was gained to the abdominal cavity. A 0 Vicryl purse string stitch was placed in the fascia surrounding the opening. A Hassan cannula was placed through the opening and anchored in place with the previously placed Vicryl purse string stitch. The laparoscope was placed through the [facility] cannula. The abdomen was insufflated with carbon dioxide without difficulty. Next the suprapubic area was infiltrated with quarter percent Marcaine. A small incision was made  with a 15 blade knife. A 5 mm port was placed bluntly through this incision into the abdominal cavity. A site was then chosen between the 2 port for placement of a 5 mm port. The area was infiltrated with quarter percent Marcaine. A small stab incision was made with a 15 blade knife. A 5 mm port was placed bluntly through this incision and the abdominal cavity under direct vision. The laparoscope was then moved to the suprapubic port. Using a Glassman grasper and harmonic scalpel the right lower quadrant was inspected. The appendix was readily identified. The appendix was elevated anteriorly and the mesoappendix was taken down sharply with the harmonic scalpel. Once the base of the appendix where it joined the cecum was identified and cleared of any tissue then a laparoscopic GIA blue load 6 row stapler was placed through the [facility] cannula. The stapler was placed across the base of the appendix clamped and fired thereby dividing the base of the appendix between staple lines. A laparoscopic bag was then inserted through the [facility] cannula. The appendix was placed within the bag and the bag was sealed. The abdomen was then irrigated with copious amounts of saline until the effluent was clear. No other abnormalities were noted. The appendix and bag were removed with the [facility] cannula through the infraumbilical port without difficulty. The fascial defect was closed with the previously placed Vicryl pursestring stitch as well as with another interrupted 0 Vicryl figure-of-eight stitch. The rest of the ports were removed under direct vision and were found to be hemostatic. The gas was allowed to escape. The skin incisions were closed with interrupted  4-0 Monocryl subcuticular stitches. Dermabond dressings were applied. The patient tolerated the procedure well. At the end of the case all needle sponge and instrument counts were correct. The patient was then awakened and taken to recovery in stable condition.  PLAN OF  CARE: Admit to inpatient   PATIENT DISPOSITION:  PACU - hemodynamically stable.   Delay start of Pharmacological VTE agent (>24hrs) due to surgical blood loss or risk of bleeding: no

## 2017-08-31 NOTE — Anesthesia Procedure Notes (Signed)
Procedure Name: Intubation Date/Time: 08/31/2017 2:38 PM Performed by: Caren MacadamARTER, Ericha Whittingham W Pre-anesthesia Checklist: Patient identified, Emergency Drugs available, Suction available and Patient being monitored Patient Re-evaluated:Patient Re-evaluated prior to induction Oxygen Delivery Method: Circle system utilized Preoxygenation: Pre-oxygenation with 100% oxygen Induction Type: IV induction Ventilation: Mask ventilation without difficulty Laryngoscope Size: Miller and 2 Grade View: Grade I Tube type: Oral Tube size: 7.0 mm Number of attempts: 1 Airway Equipment and Method: Stylet and Oral airway Placement Confirmation: ETT inserted through vocal cords under direct vision,  positive ETCO2 and breath sounds checked- equal and bilateral Secured at: 24 cm Tube secured with: Tape Dental Injury: Teeth and Oropharynx as per pre-operative assessment

## 2017-08-31 NOTE — H&P (Signed)
George Wyatt is an 41 y.o. male.   Chief Complaint: Abdominal pain HPI: Patient is a 41 year old Spanish-speaking male with no past medical history. Patient states that the abdominal pain began approximately less than 24 hours ago. He states the pain began in his back. He states the pain localized to the right lower quadrant. He states that is where the majority of the pain is currently.  He states the pain is fairly sharp. Patient denies any nausea vomiting or diarrhea. He denies any fever. There are no modifying factors.  Patient is evaluated in the ER and underwent CT scan. CT scan revealed signs consistent with acute, nonperforated appendicitis. Labs earlier in the day revealed an elevated WBC count Surgical consultation was obtained for further evaluation and management.  History reviewed. No pertinent past medical history.  History reviewed. No pertinent surgical history.  No family history on file. Social History:  reports that he has never smoked. He has never used smokeless tobacco. He reports that he drinks alcohol. His drug history is not on file.  Allergies: No Known Allergies  No prescriptions prior to admission.    Results for orders placed or performed during the hospital encounter of 08/31/17 (from the past 48 hour(s))  Comprehensive metabolic panel     Status: Abnormal   Collection Time: 08/31/17  1:47 AM  Result Value Ref Range   Sodium 136 135 - 145 mmol/L   Potassium 3.8 3.5 - 5.1 mmol/L   Chloride 102 101 - 111 mmol/L   CO2 24 22 - 32 mmol/L   Glucose, Bld 105 (H) 65 - 99 mg/dL   BUN 10 6 - 20 mg/dL   Creatinine, Ser 0.97 0.61 - 1.24 mg/dL   Calcium 8.8 (L) 8.9 - 10.3 mg/dL   Total Protein 7.2 6.5 - 8.1 g/dL   Albumin 3.9 3.5 - 5.0 g/dL   AST 21 15 - 41 U/L   ALT 18 17 - 63 U/L   Alkaline Phosphatase 86 38 - 126 U/L   Total Bilirubin 2.6 (H) 0.3 - 1.2 mg/dL   GFR calc non Af Amer >60 >60 mL/min   GFR calc Af Amer >60 >60 mL/min    Comment: (NOTE) The eGFR  has been calculated using the CKD EPI equation. This calculation has not been validated in all clinical situations. eGFR's persistently <60 mL/min signify possible Chronic Kidney Disease.    Anion gap 10 5 - 15  Lipase, blood     Status: None   Collection Time: 08/31/17  1:47 AM  Result Value Ref Range   Lipase 30 11 - 51 U/L   Ct Abdomen Pelvis W Contrast  Result Date: 08/31/2017 CLINICAL DATA:  Right-sided abdominal pain.  Abdominal infection. EXAM: CT ABDOMEN AND PELVIS WITH CONTRAST TECHNIQUE: Multidetector CT imaging of the abdomen and pelvis was performed using the standard protocol following bolus administration of intravenous contrast. CONTRAST:  131m ISOVUE-300 IOPAMIDOL (ISOVUE-300) INJECTION 61% COMPARISON:  None. FINDINGS: Lower chest: Motion artifact at the lung bases. Dependent atelectasis. Heart size is normal. Hepatobiliary: No focal liver abnormality is seen. No gallstones, gallbladder wall thickening, or biliary dilatation. Pancreas: No ductal dilatation or inflammation. Spleen: Normal in size without focal abnormality. Adrenals/Urinary Tract: Normal adrenal glands. No hydronephrosis or perinephric edema. Simple cyst in the mid right kidney measures 4.3 cm. Symmetric excretion on delayed phase imaging. Urinary bladder is nondistended. Stomach/Bowel: The appendix is dilated measuring 9 mm, thick walled with intraluminal fluid. There is surrounding periappendiceal soft tissue stranding and  small amount of free fluid in the right lower quadrant. No abscess or perforation. Stomach, small and large bowel are unremarkable. Vascular/Lymphatic: Small right lower quadrant lymph nodes, likely reactive. Normal caliber abdominal aorta. Accessory left renal artery is incidentally noted. Portal and superior mesenteric veins are patent. Reproductive: Prostate is unremarkable. Mild cystic change to the seminal vesicles. Other: Small fat containing umbilical hernia. Small free fluid in the right lower  quadrant, no intra-abdominal abscess. No free air. Musculoskeletal: There are no acute or suspicious osseous abnormalities. IMPRESSION: Uncomplicated acute appendicitis. Electronically Signed   By: Jeb Levering M.D.   On: 08/31/2017 03:29    Review of Systems  Constitutional: Negative for chills, fever and malaise/fatigue.  HENT: Negative for ear discharge, hearing loss and sore throat.   Eyes: Negative for blurred vision and discharge.  Respiratory: Negative for cough and shortness of breath.   Cardiovascular: Negative for chest pain, orthopnea and leg swelling.  Gastrointestinal: Negative for abdominal pain, constipation, diarrhea, heartburn, nausea and vomiting.  Musculoskeletal: Negative for myalgias and neck pain.  Skin: Negative for itching and rash.  Neurological: Negative for dizziness, focal weakness, seizures and loss of consciousness.  Endo/Heme/Allergies: Negative for environmental allergies. Does not bruise/bleed easily.  Psychiatric/Behavioral: Negative for depression and suicidal ideas.  All other systems reviewed and are negative.   Blood pressure 115/86, pulse 78, temperature 98.4 F (36.9 C), temperature source Oral, resp. rate 18, height 5' 4"  (1.626 m), weight 87.3 kg (192 lb 7.4 oz), SpO2 98 %. Physical Exam  Constitutional: He is oriented to person, place, and time. Vital signs are normal. He appears well-developed and well-nourished.  Conversant No acute distress  HENT:  Head: Normocephalic and atraumatic.  Eyes: Pupils are equal, round, and reactive to light. Conjunctivae, EOM and lids are normal. No scleral icterus.  No lid lag Moist conjunctiva  Neck: Normal range of motion. No tracheal tenderness present. No tracheal deviation present. No thyromegaly present.  No cervical lymphadenopathy  Cardiovascular: Normal rate, regular rhythm, normal heart sounds and intact distal pulses.   No murmur heard. Respiratory: Effort normal and breath sounds normal. He  has no wheezes. He has no rales.  GI: Soft. Bowel sounds are normal. He exhibits no distension and no mass. There is no hepatosplenomegaly. There is tenderness. There is tenderness at McBurney's point. There is no rebound and no guarding. No hernia.  Musculoskeletal: Normal range of motion. He exhibits no edema, tenderness or deformity.  Neurological: He is alert and oriented to person, place, and time.  Normal gait and station  Skin: Skin is warm. No rash noted. No cyanosis. Nails show no clubbing.  Normal skin turgor  Psychiatric: Judgment normal.  Appropriate affect     Assessment/Plan 41 year old male acute appendicitis  1. Patient was given antibiotics in the ER. 2. We will admit him, nothing by mouth, consent for lap appendectomy. I will discussed the patient with Dr. Barry Dienes. 3. I discussed with the patient the risks benefits of the procedure to include but not limited to: Infection, bleeding, damage to surrounding structures, possible ileus, possible postoperative infection. Patient voiced understanding and wishes to proceed.   Reyes Ivan, MD 08/31/2017, 6:23 AM

## 2017-08-31 NOTE — Anesthesia Postprocedure Evaluation (Signed)
Anesthesia Post Note  Patient: George Wyatt  Procedure(s) Performed: Procedure(s) (LRB): APPENDECTOMY LAPAROSCOPIC (N/A)     Patient location during evaluation: PACU Anesthesia Type: General Level of consciousness: awake and alert Pain management: pain level controlled Vital Signs Assessment: post-procedure vital signs reviewed and stable Respiratory status: spontaneous breathing, nonlabored ventilation, respiratory function stable and patient connected to nasal cannula oxygen Cardiovascular status: blood pressure returned to baseline and stable Postop Assessment: no signs of nausea or vomiting Anesthetic complications: no    Last Vitals:  Vitals:   08/31/17 1646 08/31/17 1719  BP: 110/82 107/73  Pulse: 67 65  Resp: 17 16  Temp: (!) 36.3 C 36.4 C  SpO2: 94% 96%    Last Pain:  Vitals:   08/31/17 1719  TempSrc: Oral  PainSc:                  Jolena Kittle DAVID

## 2017-08-31 NOTE — Interval H&P Note (Signed)
History and Physical Interval Note:  08/31/2017 1:54 PM  George Wyatt  has presented today for surgery, with the diagnosis of Acute Appendicitis  The various methods of treatment have been discussed with the patient and family. After consideration of risks, benefits and other options for treatment, the patient has consented to  Procedure(s): APPENDECTOMY LAPAROSCOPIC (N/A) as a surgical intervention .  The patient's history has been reviewed, patient examined, no change in status, stable for surgery.  I have reviewed the patient's chart and labs.  Questions were answered to the patient's satisfaction.     TOTH III,[name] S

## 2017-09-01 ENCOUNTER — Encounter (HOSPITAL_COMMUNITY): Payer: Self-pay | Admitting: General Surgery

## 2017-09-01 MED ORDER — HYDROCODONE-ACETAMINOPHEN 5-325 MG PO TABS
1.0000 | ORAL_TABLET | ORAL | 0 refills | Status: DC | PRN
Start: 2017-09-01 — End: 2018-07-26

## 2017-09-01 MED ORDER — PHENOL 1.4 % MT LIQD
1.0000 | OROMUCOSAL | Status: DC | PRN
  Administered 2017-09-01: 1 via OROMUCOSAL
  Filled 2017-09-01: qty 177

## 2017-09-01 NOTE — Progress Notes (Signed)
Interpreter Wyvonnia DuskyGraciela Namihira for [facility]  discharge

## 2017-09-01 NOTE — Discharge Summary (Signed)
[facility] Discharge Summary   Patient ID: George Wyatt MRN: [id number] DOB/AGE: [birth year] 41 y.o.  Admit date: 08/31/2017 Discharge date: 09/01/2017  Admitting Diagnosis: Acute appendicitis  Discharge Diagnosis Patient Active Problem List   Diagnosis Date Noted  . Acute appendicitis 08/31/2017    Consultants None Imaging: Ct Abdomen Pelvis W Contrast  Result Date: 08/31/2017 CLINICAL DATA:  Right-sided abdominal pain.  Abdominal infection. EXAM: CT ABDOMEN AND PELVIS WITH CONTRAST TECHNIQUE: Multidetector CT imaging of the abdomen and pelvis was performed using the standard protocol following bolus administration of intravenous contrast. CONTRAST:  100mL ISOVUE-300 IOPAMIDOL (ISOVUE-300) INJECTION 61% COMPARISON:  None. FINDINGS: Lower chest: Motion artifact at the lung bases. Dependent atelectasis. Heart size is normal. Hepatobiliary: No focal liver abnormality is seen. No gallstones, gallbladder wall thickening, or biliary dilatation. Pancreas: No ductal dilatation or inflammation. Spleen: Normal in size without focal abnormality. Adrenals/Urinary Tract: Normal adrenal glands. No hydronephrosis or perinephric edema. Simple cyst in the mid right kidney measures 4.3 cm. Symmetric excretion on delayed phase imaging. Urinary bladder is nondistended. Stomach/Bowel: The appendix is dilated measuring 9 mm, thick walled with intraluminal fluid. There is surrounding periappendiceal soft tissue stranding and small amount of free fluid in the right lower quadrant. No abscess or perforation. Stomach, small and large bowel are unremarkable. Vascular/Lymphatic: Small right lower quadrant lymph nodes, likely reactive. Normal caliber abdominal aorta. Accessory left renal artery is incidentally noted. Portal and superior mesenteric veins are patent. Reproductive: Prostate is unremarkable. Mild cystic change to the seminal vesicles. Other: Small fat containing umbilical hernia. Small free fluid  in the right lower quadrant, no intra-abdominal abscess. No free air. Musculoskeletal: There are no acute or suspicious osseous abnormalities. IMPRESSION: Uncomplicated acute appendicitis. Electronically Signed   By: Rubye OaksMelanie  Ehinger M.D.   On: 08/31/2017 03:29    Procedures Dr. Carolynne Edouardoth (08/31/17) - Laparoscopic Appendectomy  Hospital Course:  Patient is a 41 y.o. male [facility].  Workup showed acute appendicitis.  Patient was admitted and underwent procedure listed above.  Tolerated procedure well and was transferred to the floor.  Diet was advanced as tolerated.  On POD#1, the patient was voiding well, tolerating diet, ambulating well, pain well controlled, vital signs stable, incisions c/d/i and felt stable for discharge home.  Patient will follow up in our office in 2 weeks and knows to call with questions or concerns.  He will call to confirm appointment date/time.    Physical Exam: General:  Alert, NAD, pleasant, comfortable Abd:  Soft, ND, mild tenderness, incisions C/D/I  Allergies as of 09/01/2017   No Known Allergies     Medication List    TAKE these medications   HYDROcodone-acetaminophen 5-325 MG tablet Commonly known as:  NORCO/VICODIN Take 1 tablet by mouth every 4 (four) hours as needed for moderate pain.            Discharge Care Instructions        Start     Ordered   09/01/17 0000  HYDROcodone-acetaminophen (NORCO/VICODIN) 5-325 MG tablet  Every 4 hours PRN    Question:  Supervising Provider  Answer:  Almond LintBYERLY, FAERA   09/01/17 1005       Follow-up Information    [facility], [facility] Go on 09/15/2017.   Specialty:  General Surgery Why:  Your appointment is at 3:30 PM. Please arrive by 3 PM for check in. Bring photo ID and insurance information.  Contact information: [address] [place]  [address] [phone]           Signed: Wells Guiles , [facility] 09/01/2017, 10:06 AM Pager:  [phone] Consults: [phone] Mon-Fri 7:00 am-4:30 pm Sat-Sun 7:00 am-11:30 am

## 2017-09-01 NOTE — Progress Notes (Signed)
Patient ambulated in hall 5 rounds and pass a little gas. No PRN pain medication requested since pain is about 2/10.  Patient states that he is hungry.  Will endorse to day shift RN appropriately.

## 2017-09-01 NOTE — Progress Notes (Signed)
Discharge paperwork reviewed with patient and patient's wife through the use of an interpretor. Prescription given. Work note given. Patient is ready for discharge.

## 2018-07-23 ENCOUNTER — Emergency Department (HOSPITAL_COMMUNITY): Payer: Self-pay

## 2018-07-23 ENCOUNTER — Encounter (HOSPITAL_COMMUNITY): Payer: Self-pay

## 2018-07-23 ENCOUNTER — Inpatient Hospital Stay (HOSPITAL_COMMUNITY)
Admission: EM | Admit: 2018-07-23 | Discharge: 2018-07-26 | DRG: 964 | Disposition: A | Discharge status: Home / Self Care | Payer: Self-pay | Attending: General Surgery | Admitting: General Surgery

## 2018-07-23 ENCOUNTER — Other Ambulatory Visit: Payer: Self-pay

## 2018-07-23 DIAGNOSIS — S3289XA Fracture of other parts of pelvis, initial encounter for closed fracture: Secondary | ICD-10-CM

## 2018-07-23 DIAGNOSIS — W132XXA Fall from, out of or through roof, initial encounter: Secondary | ICD-10-CM | POA: Diagnosis present

## 2018-07-23 DIAGNOSIS — Z4682 Encounter for fitting and adjustment of non-vascular catheter: Secondary | ICD-10-CM

## 2018-07-23 DIAGNOSIS — S270XXA Traumatic pneumothorax, initial encounter: Secondary | ICD-10-CM | POA: Diagnosis present

## 2018-07-23 DIAGNOSIS — W19XXXA Unspecified fall, initial encounter: Secondary | ICD-10-CM

## 2018-07-23 DIAGNOSIS — J939 Pneumothorax, unspecified: Secondary | ICD-10-CM

## 2018-07-23 DIAGNOSIS — S2241XA Multiple fractures of ribs, right side, initial encounter for closed fracture: Secondary | ICD-10-CM | POA: Diagnosis present

## 2018-07-23 DIAGNOSIS — S32810A Multiple fractures of pelvis with stable disruption of pelvic ring, initial encounter for closed fracture: Principal | ICD-10-CM | POA: Diagnosis present

## 2018-07-23 LAB — CBC WITH DIFFERENTIAL/PLATELET
ABS IMMATURE GRANULOCYTES: 0.2 10*3/uL — AB (ref 0.0–0.1)
BASOS PCT: 0 %
Basophils Absolute: 0 10*3/uL (ref 0.0–0.1)
EOS ABS: 0.1 10*3/uL (ref 0.0–0.7)
Eosinophils Relative: 1 %
HCT: 51.6 % (ref 39.0–52.0)
Hemoglobin: 16.7 g/dL (ref 13.0–17.0)
IMMATURE GRANULOCYTES: 2 %
Lymphocytes Relative: 26 %
Lymphs Abs: 2.6 10*3/uL (ref 0.7–4.0)
MCH: 28.7 pg (ref 26.0–34.0)
MCHC: 32.4 g/dL (ref 30.0–36.0)
MCV: 88.7 fL (ref 78.0–100.0)
Monocytes Absolute: 0.6 10*3/uL (ref 0.1–1.0)
Monocytes Relative: 6 %
NEUTROS PCT: 65 %
Neutro Abs: 6.6 10*3/uL (ref 1.7–7.7)
Platelets: 386 10*3/uL (ref 150–400)
RBC: 5.82 MIL/uL — AB (ref 4.22–5.81)
RDW: 12.5 % (ref 11.5–15.5)
WBC: 10.1 10*3/uL (ref 4.0–10.5)

## 2018-07-23 LAB — COMPREHENSIVE METABOLIC PANEL
ALT: 32 U/L (ref 0–44)
AST: 36 U/L (ref 15–41)
Albumin: 4.7 g/dL (ref 3.5–5.0)
Alkaline Phosphatase: 97 U/L (ref 38–126)
Anion gap: 12 (ref 5–15)
BUN: 17 mg/dL (ref 6–20)
CALCIUM: 9.5 mg/dL (ref 8.9–10.3)
CHLORIDE: 108 mmol/L (ref 98–111)
CO2: 22 mmol/L (ref 22–32)
CREATININE: 1.14 mg/dL (ref 0.61–1.24)
Glucose, Bld: 124 mg/dL — ABNORMAL HIGH (ref 70–99)
Potassium: 3.6 mmol/L (ref 3.5–5.1)
SODIUM: 142 mmol/L (ref 135–145)
Total Bilirubin: 2.9 mg/dL — ABNORMAL HIGH (ref 0.3–1.2)
Total Protein: 8.4 g/dL — ABNORMAL HIGH (ref 6.5–8.1)

## 2018-07-23 LAB — MRSA PCR SCREENING: MRSA by PCR: NEGATIVE

## 2018-07-23 LAB — LIPASE, BLOOD: LIPASE: 33 U/L (ref 11–51)

## 2018-07-23 MED ORDER — ENOXAPARIN SODIUM 40 MG/0.4ML ~~LOC~~ SOLN
40.0000 mg | SUBCUTANEOUS | Status: DC
  Administered 2018-07-23 – 2018-07-25 (×3): 40 mg via SUBCUTANEOUS
  Filled 2018-07-23 (×3): qty 0.4

## 2018-07-23 MED ORDER — OXYCODONE HCL 5 MG PO TABS
5.0000 mg | ORAL_TABLET | Freq: Four times a day (QID) | ORAL | Status: DC | PRN
  Administered 2018-07-24: 10 mg via ORAL
  Administered 2018-07-25: 5 mg via ORAL
  Filled 2018-07-23: qty 1
  Filled 2018-07-23: qty 2

## 2018-07-23 MED ORDER — SODIUM CHLORIDE 0.9 % IV BOLUS
1000.0000 mL | Freq: Once | INTRAVENOUS | Status: AC
  Administered 2018-07-23: 1000 mL via INTRAVENOUS

## 2018-07-23 MED ORDER — HYDROMORPHONE HCL 1 MG/ML IJ SOLN
1.0000 mg | Freq: Once | INTRAMUSCULAR | Status: AC
  Administered 2018-07-23: 1 mg via INTRAVENOUS
  Filled 2018-07-23: qty 1

## 2018-07-23 MED ORDER — ONDANSETRON 4 MG PO TBDP: 4.0000 mg | ORAL_TABLET | Freq: Four times a day (QID) | ORAL | Status: DC | PRN

## 2018-07-23 MED ORDER — IOHEXOL 300 MG/ML  SOLN
100.0000 mL | Freq: Once | INTRAMUSCULAR | Status: AC | PRN
  Administered 2018-07-23: 100 mL via INTRAVENOUS

## 2018-07-23 MED ORDER — ONDANSETRON HCL 4 MG/2ML IJ SOLN
4.0000 mg | Freq: Once | INTRAMUSCULAR | Status: AC
  Administered 2018-07-23: 4 mg via INTRAVENOUS
  Filled 2018-07-23: qty 2

## 2018-07-23 MED ORDER — DOCUSATE SODIUM 100 MG PO CAPS
100.0000 mg | ORAL_CAPSULE | Freq: Two times a day (BID) | ORAL | Status: DC
  Administered 2018-07-23 – 2018-07-26 (×6): 100 mg via ORAL
  Filled 2018-07-23 (×6): qty 1

## 2018-07-23 MED ORDER — KCL IN DEXTROSE-NACL 20-5-0.45 MEQ/L-%-% IV SOLN
INTRAVENOUS | Status: AC
  Administered 2018-07-23: 21:00:00 via INTRAVENOUS
  Filled 2018-07-23: qty 1000

## 2018-07-23 MED ORDER — ACETAMINOPHEN 325 MG PO TABS: 650.0000 mg | ORAL_TABLET | ORAL | Status: DC | PRN

## 2018-07-23 MED ORDER — MORPHINE SULFATE (PF) 4 MG/ML IV SOLN
4.0000 mg | Freq: Once | INTRAVENOUS | Status: AC
  Administered 2018-07-23: 4 mg via INTRAVENOUS
  Filled 2018-07-23: qty 1

## 2018-07-23 MED ORDER — ONDANSETRON HCL 4 MG/2ML IJ SOLN: 4.0000 mg | Freq: Four times a day (QID) | INTRAMUSCULAR | Status: DC | PRN

## 2018-07-23 MED ORDER — MORPHINE SULFATE (PF) 4 MG/ML IV SOLN
1.0000 mg | INTRAVENOUS | Status: DC | PRN
  Administered 2018-07-23 – 2018-07-25 (×4): 2 mg via INTRAVENOUS
  Filled 2018-07-23 (×4): qty 1

## 2018-07-23 NOTE — ED Notes (Signed)
The pt s trauma was stopped ac cidentally earlier now it has been stopped

## 2018-07-23 NOTE — Consult Note (Signed)
ORTHOPAEDIC CONSULTATION  REQUESTING PHYSICIAN: Md, Trauma, MD  PCP:  Patient, No Pcp Per  Chief Complaint: fall, level 2 trauma  HPI: George Wyatt is a 42 y.o. male who complains of right-sided chest pain, right elbow pain, and right hip pain following a fall from approximately 30 to 40 feet from a ladder at work.  He does work placing shingles.  He presented to the emergency department as a level 2 trauma.  He describes having a little bit of numbness on the thigh as well as the above pain.  He denies diabetes or smoking.  He is being admitted to the trauma service due to multiple rib fractures.  Orthopedics was consulted for evaluation of a right-sided root of ramus fracture.  History reviewed. No pertinent past medical history. Past Surgical History:  Procedure Laterality Date  . FOOT SURGERY    . LAPAROSCOPIC APPENDECTOMY N/A 08/31/2017   Procedure: APPENDECTOMY LAPAROSCOPIC;  Surgeon: Griselda Miner, MD;  Location: [facility] [place] OR;  Service: General;  Laterality: N/A;   Social History   Socioeconomic History  . Marital status: Single    Spouse name: Not on file  . Number of children: Not on file  . Years of education: Not on file  . Highest education level: Not on file  Occupational History  . Not on file  Social Needs  . Financial resource strain: Not on file  . Food insecurity:    Worry: Not on file    Inability: Not on file  . Transportation needs:    Medical: Not on file    Non-medical: Not on file  Tobacco Use  . Smoking status: Never Smoker  . Smokeless tobacco: Never Used  Substance and Sexual Activity  . Alcohol use: Yes    Comment: occasionally  . Drug use: Not on file  . Sexual activity: Not on file  Lifestyle  . Physical activity:    Days per week: Not on file    Minutes per session: Not on file  . Stress: Not on file  Relationships  . Social connections:    Talks on phone: Not on file    Gets together: Not on file    Attends religious service: Not on  file    Active member of club or organization: Not on file    Attends meetings of clubs or organizations: Not on file    Relationship status: Not on file  Other Topics Concern  . Not on file  Social History Narrative  . Not on file   No family history on file. No Known Allergies Prior to Admission medications   Medication Sig Start Date End Date Taking? Authorizing Provider  HYDROcodone-acetaminophen (NORCO/VICODIN) 5-325 MG tablet Take 1 tablet by mouth every 4 (four) hours as needed for moderate pain. Patient not taking: Reported on 07/23/2018 09/01/17   Rayburn, Alphonsus Sias, PA-C   Dg Elbow Complete Right (3+view)  Result Date: 07/23/2018 CLINICAL DATA:  Right elbow injury.  Fall EXAM: RIGHT ELBOW - COMPLETE 3+ VIEW COMPARISON:  None. FINDINGS: There is no evidence of fracture, dislocation, or joint effusion. There is no evidence of arthropathy or other focal bone abnormality. Soft tissues are unremarkable. IMPRESSION: Negative. Electronically Signed   By: Charlett Nose M.D.   On: 07/23/2018 16:41   Ct Head Wo Contrast  Result Date: 07/23/2018 CLINICAL DATA:  Fall from roof.  Neck and back pain EXAM: CT HEAD WITHOUT CONTRAST CT CERVICAL SPINE WITHOUT CONTRAST TECHNIQUE: Multidetector CT imaging of the head  and cervical spine was performed following the standard protocol without intravenous contrast. Multiplanar CT image reconstructions of the cervical spine were also generated. COMPARISON:  None. FINDINGS: CT HEAD FINDINGS Brain: No acute intracranial abnormality. Specifically, no hemorrhage, hydrocephalus, mass lesion, acute infarction, or significant intracranial injury. Vascular: No hyperdense vessel or unexpected calcification. Skull: No acute calvarial abnormality. Sinuses/Orbits: Vessel thickening within the ethmoid air cells. No air-fluid levels. Mastoid air cells clear. Orbital soft tissues unremarkable. Other: Probable old left medial orbital wall blowout fracture. CT CERVICAL SPINE  FINDINGS Alignment: Normal Skull base and vertebrae: No acute fracture. No primary bone lesion or focal pathologic process. Soft tissues and spinal canal: No prevertebral fluid or swelling. No visible canal hematoma. Disc levels:  Maintains Upper chest: Pneumothorax noted in the right apex. Other: No acute findings IMPRESSION: No intracranial abnormality. No acute bony abnormality in cervical spine. Small right apical pneumothorax. See further discussion in the chest CT report. Electronically Signed   By: Charlett Nose M.D.   On: 07/23/2018 16:13   Ct Chest W Contrast  Result Date: 07/23/2018 CLINICAL DATA:  Level 2 trauma.  Fall from roof. EXAM: CT CHEST, ABDOMEN, AND PELVIS WITH CONTRAST TECHNIQUE: Multidetector CT imaging of the chest, abdomen and pelvis was performed following the standard protocol during bolus administration of intravenous contrast. CONTRAST:  OMNIPAQUE IOHEXOL 300 MG/ML  SOLN COMPARISON:  None. FINDINGS: CT CHEST FINDINGS Cardiovascular: Normal heart size. No pericardial effusion. No evidence of great vessel injury. Mediastinum/Nodes: Negative for pneumomediastinum or mediastinal hematoma. Lungs/Pleura: Small right pneumothorax, less than 10%. Mild atelectasis in the right more than left lung. No hemothorax. Musculoskeletal: Anterior right first, second, third, and fourth rib fractures. The first rib fracture is segmental posteriorly and at the costochondral junction. There is up to mild displacement CT ABDOMEN PELVIS FINDINGS Hepatobiliary: Negative Pancreas: Negative Spleen: Negative Adrenals/Urinary Tract: No evidence of adrenal or renal injury. Negative urinary bladder. High-density in the bilateral renal collecting system from early contrast excretion. Simple right renal cyst measuring 5 cm Stomach/Bowel: No evidence of injury.  Appendectomy Vascular/Lymphatic: No evidence of vascular injury. No retroperitoneal hematoma. Reproductive: Negative Other: No ascites or  pneumoperitoneum Musculoskeletal: Nondisplaced fracture at the puboacetabular junction on the right Soft tissue contusion superficial to the right greater trochanter. Critical Value/emergent results were called by telephone at the time of interpretation on 07/23/2018 at 4:21 pm to Dr. Jacalyn Lefevre , who verbally acknowledged these results. IMPRESSION: 1. Right first through fourth rib fractures with small right apical pneumothorax, less than 10%. 2. Nondisplaced right puboacetabular junction fracture. 3. No evidence of intra-abdominal injury. 4. Soft tissue contusion lateral to the right hip Electronically Signed   By: Marnee Spring M.D.   On: 07/23/2018 16:21   Ct Cervical Spine Wo Contrast  Result Date: 07/23/2018 CLINICAL DATA:  Fall from roof.  Neck and back pain EXAM: CT HEAD WITHOUT CONTRAST CT CERVICAL SPINE WITHOUT CONTRAST TECHNIQUE: Multidetector CT imaging of the head and cervical spine was performed following the standard protocol without intravenous contrast. Multiplanar CT image reconstructions of the cervical spine were also generated. COMPARISON:  None. FINDINGS: CT HEAD FINDINGS Brain: No acute intracranial abnormality. Specifically, no hemorrhage, hydrocephalus, mass lesion, acute infarction, or significant intracranial injury. Vascular: No hyperdense vessel or unexpected calcification. Skull: No acute calvarial abnormality. Sinuses/Orbits: Vessel thickening within the ethmoid air cells. No air-fluid levels. Mastoid air cells clear. Orbital soft tissues unremarkable. Other: Probable old left medial orbital wall blowout fracture. CT CERVICAL SPINE FINDINGS Alignment:  Normal Skull base and vertebrae: No acute fracture. No primary bone lesion or focal pathologic process. Soft tissues and spinal canal: No prevertebral fluid or swelling. No visible canal hematoma. Disc levels:  Maintains Upper chest: Pneumothorax noted in the right apex. Other: No acute findings IMPRESSION: No intracranial  abnormality. No acute bony abnormality in cervical spine. Small right apical pneumothorax. See further discussion in the chest CT report. Electronically Signed   By: Charlett Nose M.D.   On: 07/23/2018 16:13   Ct Abdomen Pelvis W Contrast  Result Date: 07/23/2018 CLINICAL DATA:  Level 2 trauma.  Fall from roof. EXAM: CT CHEST, ABDOMEN, AND PELVIS WITH CONTRAST TECHNIQUE: Multidetector CT imaging of the chest, abdomen and pelvis was performed following the standard protocol during bolus administration of intravenous contrast. CONTRAST:  OMNIPAQUE IOHEXOL 300 MG/ML  SOLN COMPARISON:  None. FINDINGS: CT CHEST FINDINGS Cardiovascular: Normal heart size. No pericardial effusion. No evidence of great vessel injury. Mediastinum/Nodes: Negative for pneumomediastinum or mediastinal hematoma. Lungs/Pleura: Small right pneumothorax, less than 10%. Mild atelectasis in the right more than left lung. No hemothorax. Musculoskeletal: Anterior right first, second, third, and fourth rib fractures. The first rib fracture is segmental posteriorly and at the costochondral junction. There is up to mild displacement CT ABDOMEN PELVIS FINDINGS Hepatobiliary: Negative Pancreas: Negative Spleen: Negative Adrenals/Urinary Tract: No evidence of adrenal or renal injury. Negative urinary bladder. High-density in the bilateral renal collecting system from early contrast excretion. Simple right renal cyst measuring 5 cm Stomach/Bowel: No evidence of injury.  Appendectomy Vascular/Lymphatic: No evidence of vascular injury. No retroperitoneal hematoma. Reproductive: Negative Other: No ascites or pneumoperitoneum Musculoskeletal: Nondisplaced fracture at the puboacetabular junction on the right Soft tissue contusion superficial to the right greater trochanter. Critical Value/emergent results were called by telephone at the time of interpretation on 07/23/2018 at 4:21 pm to Dr. Jacalyn Lefevre , who verbally acknowledged these results.  IMPRESSION: 1. Right first through fourth rib fractures with small right apical pneumothorax, less than 10%. 2. Nondisplaced right puboacetabular junction fracture. 3. No evidence of intra-abdominal injury. 4. Soft tissue contusion lateral to the right hip Electronically Signed   By: Marnee Spring M.D.   On: 07/23/2018 16:21   Dg Chest Portable 1 View  Result Date: 07/23/2018 CLINICAL DATA:  Fall from 2 story roof with chest pain, initial encounter EXAM: PORTABLE CHEST 1 VIEW COMPARISON:  None. FINDINGS: Cardiac shadow is within normal limits. The lungs are poorly aerated although no infiltrate or pneumothorax is seen. No acute bony abnormality is noted. IMPRESSION: No acute abnormality seen. Electronically Signed   By: Alcide Clever M.D.   On: 07/23/2018 14:33    Positive ROS: All other systems have been reviewed and were otherwise negative with the exception of those mentioned in the HPI and as above.  Physical Exam: General: Alert, no acute distress Cardiovascular: No pedal edema Respiratory: No cyanosis, no use of accessory musculature GI: No organomegaly, abdomen is soft and non-tender Skin: No lesions in the area of chief complaint Neurologic: Sensation intact distally Psychiatric: Patient is competent for consent with normal mood and affect Lymphatic: No axillary or cervical lymphadenopathy  MUSCULOSKELETAL:   Right upper extremity:  No obvious deformities.  He is tender to palpation generally along the distal brachium and at the elbow.  He has full symmetric range of motion side to side and is neurovascularly intact.  No focal lesion, no focal point tenderness.   Right hip:  Mild pain with logroll.  Unable to  do a straight leg raise secondary to pain at the hip and groin.  He is tender along the lateral hip as well.  He has some sensory deficit along the lateral femoral cutaneous nerve.  Otherwise distally he is intact neurovascularly.  2+ dorsalis pedis pulse.  Assessment: 1.   Closed right root of ramus fracture with minimal displacement   Plan: -His pelvic ring fracture will be appropriately managed nonoperatively.  He is appropriate for weightbearing as tolerated with assistive devices as indicated from his physical therapy evaluation. -No further work-up needed per orthopedics.  He can follow-up with me in 2 to 3 weeks with new x-rays.    Yolonda KidaJason Patrick Rogers, MD Cell [phone]    07/23/2018 6:11 PM

## 2018-07-23 NOTE — ED Notes (Signed)
Dr Aundria Rudrogers at the bedside

## 2018-07-23 NOTE — ED Triage Notes (Signed)
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 Patient arrived complaining of right sided body pain and right anterior CP. c-collar placed upon arrival to trauma room and Level 2 activated. Interpretor machine brought to bedside. No bruising, no abrasions. Strong distal pulses. Pain to chest worse with inspiration

## 2018-07-23 NOTE — ED Notes (Signed)
Dr Donell Beersbyerly at the bedside.  Pt just returned from c-t  Pain med given

## 2018-07-23 NOTE — H&P (Signed)
History   George Wyatt is an 42 y.o. male.   Chief Complaint:  Chief Complaint  Patient presents with  . Fall/Level 2    Pt is a 42 yo M who was involved in a fall from a roof while working on placing shingles.  He landed on his right side and felt short of breath.  He came in as a level 2 trauma.  He states that his whole right side hurts.  He has a history of a prior injury on his right lower leg requiring multiple surgeries.  He denies abdominal pain, nausea or vomiting.     History reviewed. No pertinent past medical history.  Past Surgical History:  Procedure Laterality Date  . FOOT SURGERY    . LAPAROSCOPIC APPENDECTOMY N/A 08/31/2017   Procedure: APPENDECTOMY LAPAROSCOPIC;  Surgeon: Jovita Kussmaul, MD;  Location: [facility];  Service: General;  Laterality: N/A;    No family history on file. Social History:  reports that he has never smoked. He has never used smokeless tobacco. He reports that he drinks alcohol. His drug history is not on file.  Allergies  No Known Allergies  Home Medications   (Not in a hospital admission)  Trauma Course   Results for orders placed or performed during the hospital encounter of 07/23/18 (from the past 48 hour(s))  Comprehensive metabolic panel     Status: Abnormal   Collection Time: 07/23/18  2:03 PM  Result Value Ref Range   Sodium 142 135 - 145 mmol/L   Potassium 3.6 3.5 - 5.1 mmol/L   Chloride 108 98 - 111 mmol/L   CO2 22 22 - 32 mmol/L   Glucose, Bld 124 (H) 70 - 99 mg/dL   BUN 17 6 - 20 mg/dL   Creatinine, Ser 1.14 0.61 - 1.24 mg/dL   Calcium 9.5 8.9 - 10.3 mg/dL   Total Protein 8.4 (H) 6.5 - 8.1 g/dL   Albumin 4.7 3.5 - 5.0 g/dL   AST 36 15 - 41 U/L   ALT 32 0 - 44 U/L   Alkaline Phosphatase 97 38 - 126 U/L   Total Bilirubin 2.9 (H) 0.3 - 1.2 mg/dL   GFR calc non Af Amer >60 >60 mL/min   GFR calc Af Amer >60 >60 mL/min    Comment: (NOTE) The eGFR has been calculated using the CKD EPI equation. This calculation has not  been validated in all clinical situations. eGFR's persistently <60 mL/min signify possible Chronic Kidney Disease.    Anion gap 12 5 - 15    Comment: Performed at [facility] [address], [place],  [address]  CBC with Differential     Status: Abnormal   Collection Time: 07/23/18  2:03 PM  Result Value Ref Range   WBC 10.1 4.0 - 10.5 K/uL   RBC 5.82 (H) 4.22 - 5.81 MIL/uL   Hemoglobin 16.7 13.0 - 17.0 g/dL   HCT 51.6 39.0 - 52.0 %   MCV 88.7 78.0 - 100.0 fL   MCH 28.7 26.0 - 34.0 pg   MCHC 32.4 30.0 - 36.0 g/dL   RDW 12.5 11.5 - 15.5 %   Platelets 386 150 - 400 K/uL   Neutrophils Relative % 65 %   Neutro Abs 6.6 1.7 - 7.7 K/uL   Lymphocytes Relative 26 %   Lymphs Abs 2.6 0.7 - 4.0 K/uL   Monocytes Relative 6 %   Monocytes Absolute 0.6 0.1 - 1.0 K/uL   Eosinophils Relative 1 %  Eosinophils Absolute 0.1 0.0 - 0.7 K/uL   Basophils Relative 0 %   Basophils Absolute 0.0 0.0 - 0.1 K/uL   Immature Granulocytes 2 %   Abs Immature Granulocytes 0.2 (H) 0.0 - 0.1 K/uL    Comment: Performed at [facility] [address], [place], [place] [address]  Lipase, blood     Status: None   Collection Time: 07/23/18  2:03 PM  Result Value Ref Range   Lipase 33 11 - 51 U/L    Comment: Performed at [facility] [address] [place], [place] [address]   Dg Elbow Complete Right (3+view)  Result Date: 07/23/2018 CLINICAL DATA:  Right elbow injury.  Fall EXAM: RIGHT ELBOW - COMPLETE 3+ VIEW COMPARISON:  None. FINDINGS: There is no evidence of fracture, dislocation, or joint effusion. There is no evidence of arthropathy or other focal bone abnormality. Soft tissues are unremarkable. IMPRESSION: Negative. Electronically Signed   By: Rolm Baptise M.D.   On: 07/23/2018 16:41   Ct Head Wo Contrast  Result Date: 07/23/2018 CLINICAL DATA:  Fall from roof.  Neck and back pain EXAM: CT HEAD WITHOUT CONTRAST CT CERVICAL SPINE WITHOUT CONTRAST TECHNIQUE: Multidetector CT  imaging of the head and cervical spine was performed following the standard protocol without intravenous contrast. Multiplanar CT image reconstructions of the cervical spine were also generated. COMPARISON:  None. FINDINGS: CT HEAD FINDINGS Brain: No acute intracranial abnormality. Specifically, no hemorrhage, hydrocephalus, mass lesion, acute infarction, or significant intracranial injury. Vascular: No hyperdense vessel or unexpected calcification. Skull: No acute calvarial abnormality. Sinuses/Orbits: Vessel thickening within the ethmoid air cells. No air-fluid levels. Mastoid air cells clear. Orbital soft tissues unremarkable. Other: Probable old left medial orbital wall blowout fracture. CT CERVICAL SPINE FINDINGS Alignment: Normal Skull base and vertebrae: No acute fracture. No primary bone lesion or focal pathologic process. Soft tissues and spinal canal: No prevertebral fluid or swelling. No visible canal hematoma. Disc levels:  Maintains Upper chest: Pneumothorax noted in the right apex. Other: No acute findings IMPRESSION: No intracranial abnormality. No acute bony abnormality in cervical spine. Small right apical pneumothorax. See further discussion in the chest CT report. Electronically Signed   By: Rolm Baptise M.D.   On: 07/23/2018 16:13   Ct Chest W Contrast  Result Date: 07/23/2018 CLINICAL DATA:  Level 2 trauma.  Fall from roof. EXAM: CT CHEST, ABDOMEN, AND PELVIS WITH CONTRAST TECHNIQUE: Multidetector CT imaging of the chest, abdomen and pelvis was performed following the standard protocol during bolus administration of intravenous contrast. CONTRAST:  124m OMNIPAQUE IOHEXOL 300 MG/ML  SOLN COMPARISON:  None. FINDINGS: CT CHEST FINDINGS Cardiovascular: Normal heart size. No pericardial effusion. No evidence of great vessel injury. Mediastinum/Nodes: Negative for pneumomediastinum or mediastinal hematoma. Lungs/Pleura: Small right pneumothorax, less than 10%. Mild atelectasis in the right more  than left lung. No hemothorax. Musculoskeletal: Anterior right first, second, third, and fourth rib fractures. The first rib fracture is segmental posteriorly and at the costochondral junction. There is up to mild displacement CT ABDOMEN PELVIS FINDINGS Hepatobiliary: Negative Pancreas: Negative Spleen: Negative Adrenals/Urinary Tract: No evidence of adrenal or renal injury. Negative urinary bladder. High-density in the bilateral renal collecting system from early contrast excretion. Simple right renal cyst measuring 5 cm Stomach/Bowel: No evidence of injury.  Appendectomy Vascular/Lymphatic: No evidence of vascular injury. No retroperitoneal hematoma. Reproductive: Negative Other: No ascites or pneumoperitoneum Musculoskeletal: Nondisplaced fracture at the puboacetabular junction on the right Soft tissue contusion superficial to the  right greater trochanter. Critical Value/emergent results were called by telephone at the time of interpretation on 07/23/2018 at 4:21 pm to Dr. Isla Pence , who verbally acknowledged these results. IMPRESSION: 1. Right first through fourth rib fractures with small right apical pneumothorax, less than 10%. 2. Nondisplaced right puboacetabular junction fracture. 3. No evidence of intra-abdominal injury. 4. Soft tissue contusion lateral to the right hip Electronically Signed   By: Monte Fantasia M.D.   On: 07/23/2018 16:21   Ct Cervical Spine Wo Contrast  Result Date: 07/23/2018 CLINICAL DATA:  Fall from roof.  Neck and back pain EXAM: CT HEAD WITHOUT CONTRAST CT CERVICAL SPINE WITHOUT CONTRAST TECHNIQUE: Multidetector CT imaging of the head and cervical spine was performed following the standard protocol without intravenous contrast. Multiplanar CT image reconstructions of the cervical spine were also generated. COMPARISON:  None. FINDINGS: CT HEAD FINDINGS Brain: No acute intracranial abnormality. Specifically, no hemorrhage, hydrocephalus, mass lesion, acute infarction, or  significant intracranial injury. Vascular: No hyperdense vessel or unexpected calcification. Skull: No acute calvarial abnormality. Sinuses/Orbits: Vessel thickening within the ethmoid air cells. No air-fluid levels. Mastoid air cells clear. Orbital soft tissues unremarkable. Other: Probable old left medial orbital wall blowout fracture. CT CERVICAL SPINE FINDINGS Alignment: Normal Skull base and vertebrae: No acute fracture. No primary bone lesion or focal pathologic process. Soft tissues and spinal canal: No prevertebral fluid or swelling. No visible canal hematoma. Disc levels:  Maintains Upper chest: Pneumothorax noted in the right apex. Other: No acute findings IMPRESSION: No intracranial abnormality. No acute bony abnormality in cervical spine. Small right apical pneumothorax. See further discussion in the chest CT report. Electronically Signed   By: Rolm Baptise M.D.   On: 07/23/2018 16:13   Ct Abdomen Pelvis W Contrast  Result Date: 07/23/2018 CLINICAL DATA:  Level 2 trauma.  Fall from roof. EXAM: CT CHEST, ABDOMEN, AND PELVIS WITH CONTRAST TECHNIQUE: Multidetector CT imaging of the chest, abdomen and pelvis was performed following the standard protocol during bolus administration of intravenous contrast. CONTRAST:  171m OMNIPAQUE IOHEXOL 300 MG/ML  SOLN COMPARISON:  None. FINDINGS: CT CHEST FINDINGS Cardiovascular: Normal heart size. No pericardial effusion. No evidence of great vessel injury. Mediastinum/Nodes: Negative for pneumomediastinum or mediastinal hematoma. Lungs/Pleura: Small right pneumothorax, less than 10%. Mild atelectasis in the right more than left lung. No hemothorax. Musculoskeletal: Anterior right first, second, third, and fourth rib fractures. The first rib fracture is segmental posteriorly and at the costochondral junction. There is up to mild displacement CT ABDOMEN PELVIS FINDINGS Hepatobiliary: Negative Pancreas: Negative Spleen: Negative Adrenals/Urinary Tract: No evidence of  adrenal or renal injury. Negative urinary bladder. High-density in the bilateral renal collecting system from early contrast excretion. Simple right renal cyst measuring 5 cm Stomach/Bowel: No evidence of injury.  Appendectomy Vascular/Lymphatic: No evidence of vascular injury. No retroperitoneal hematoma. Reproductive: Negative Other: No ascites or pneumoperitoneum Musculoskeletal: Nondisplaced fracture at the puboacetabular junction on the right Soft tissue contusion superficial to the right greater trochanter. Critical Value/emergent results were called by telephone at the time of interpretation on 07/23/2018 at 4:21 pm to Dr. JIsla Pence, who verbally acknowledged these results. IMPRESSION: 1. Right first through fourth rib fractures with small right apical pneumothorax, less than 10%. 2. Nondisplaced right puboacetabular junction fracture. 3. No evidence of intra-abdominal injury. 4. Soft tissue contusion lateral to the right hip Electronically Signed   By: JMonte FantasiaM.D.   On: 07/23/2018 16:21   Dg Chest Portable 1 View  Result Date:  07/23/2018 CLINICAL DATA:  Fall from 2 story roof with chest pain, initial encounter EXAM: PORTABLE CHEST 1 VIEW COMPARISON:  None. FINDINGS: Cardiac shadow is within normal limits. The lungs are poorly aerated although no infiltrate or pneumothorax is seen. No acute bony abnormality is noted. IMPRESSION: No acute abnormality seen. Electronically Signed   By: Inez Catalina M.D.   On: 07/23/2018 14:33    Review of Systems  Constitutional: Negative.   HENT: Negative.   Eyes: Negative.   Respiratory: Positive for shortness of breath.   Cardiovascular: Positive for chest pain.  Gastrointestinal: Negative.   Genitourinary: Negative.   Musculoskeletal: Positive for falls and joint pain (right elbow).  Skin: Negative.   Neurological: Negative.   Endo/Heme/Allergies: Negative.   Psychiatric/Behavioral: Negative.     Blood pressure 118/83, pulse 73,  temperature (!) 97.4 F (36.3 C), temperature source Temporal, resp. rate 15, height _0  (1.753 m), weight 86.2 kg (190 lb), SpO2 97 %. Physical Exam  Constitutional: He is oriented to person, place, and time. He appears well-developed and well-nourished. He appears distressed.  HENT:  Head: Normocephalic and atraumatic.  Right Ear: External ear normal.  Left Ear: External ear normal.  Mouth/Throat: Oropharynx is clear and moist. No oropharyngeal exudate.  Eyes: Pupils are equal, round, and reactive to light. Conjunctivae are normal. Right eye exhibits no discharge. Left eye exhibits no discharge. No scleral icterus.  Neck: No JVD present. No tracheal deviation present. No thyromegaly present.  Cardiovascular: Normal rate, regular rhythm, normal heart sounds and intact distal pulses. Exam reveals no gallop and no friction rub.  No murmur heard. Respiratory: No stridor. He has no wheezes. He has no rales. He exhibits tenderness (right sided chest tenderness.).  Decreased breath sounds at top.  Pt not taking deep breaths.    GI: Soft. He exhibits no distension. There is no tenderness. There is no rebound.  Genitourinary: Penis normal.  Musculoskeletal: Normal range of motion. He exhibits tenderness (right elbow.  no deformity or swelling.  pain with movement.  ). He exhibits no edema or deformity.  Lymphadenopathy:    He has no cervical adenopathy.  Neurological: He is alert and oriented to person, place, and time. Coordination normal.  Skin: Skin is warm and dry. No rash noted. He is not diaphoretic. No erythema. No pallor.  Prior skin graft scar on right thigh.  Also with prior scars on right calf/shin.    Psychiatric: He has a normal mood and affect. His behavior is normal. Judgment and thought content normal.     Assessment/Plan Fall.   Right rib fractures 1-4 Small right ptx Pelvic fx (right puboacetabular jxn) Right elbow pain Elevated T bili  Pain control Pulmonary  toilet Repeat CXR in AM. Ortho consult for pelvic fx and right elbow pain.   Recheck LFTs in AM.  May need that worked up   [employer] 07/23/2018, 5:24 PM   Procedures

## 2018-07-23 NOTE — ED Triage Notes (Signed)
Speaks very little english comes in stating he fell 50 feet off ladder, clutching  Rt chest and groaning , hurts to breath he states

## 2018-07-23 NOTE — ED Notes (Signed)
Pt back to x ray

## 2018-07-23 NOTE — Progress Notes (Signed)
Orthopedic Tech Progress Note Patient Details:  George Wyatt March 20, 1976 [id number]  Patient ID: George Wyatt, male   DOB: March 20, 1976, 42 y.o.   MRN: [id number]   George Wyatt 07/23/2018, 2:50 PMTrauma

## 2018-07-23 NOTE — ED Notes (Signed)
Pt returned from xray appros 20 m inutes ago sleeping from pain meds

## 2018-07-23 NOTE — ED Notes (Signed)
Report given to rn on the floor 

## 2018-07-23 NOTE — ED Provider Notes (Signed)
[facility] EMERGENCY DEPARTMENT Provider Note   CSN: [id number] Arrival date & time: 07/23/18  1346     History   Chief Complaint Chief Complaint  Patient presents with  . Fall/Level 2    HPI George Wyatt is a 42 y.o. male.  Pt presents to the ED today with right sided chest pain from fall.  Pt was working on top of a roof shingling the roof, when he fell.  He denies loc. Pt said that it hurts to take a breathe, and he feels like he can't get enough breath.   Due to language barrier, a video interpreter was present during the history-taking and subsequent discussion (and for part of the physical exam) with this patient.     History reviewed. No pertinent past medical history.  Patient Active Problem List   Diagnosis Date Noted  . Acute appendicitis 08/31/2017    Past Surgical History:  Procedure Laterality Date  . FOOT SURGERY    . LAPAROSCOPIC APPENDECTOMY N/A 08/31/2017   Procedure: APPENDECTOMY LAPAROSCOPIC;  Surgeon: Griselda Miner, MD;  Location: [facility] OR;  Service: General;  Laterality: N/A;        Home Medications    Prior to Admission medications   Medication Sig Start Date End Date Taking? Authorizing Provider  HYDROcodone-acetaminophen (NORCO/VICODIN) 5-325 MG tablet Take 1 tablet by mouth every 4 (four) hours as needed for moderate pain. Patient not taking: Reported on 07/23/2018 09/01/17   Rayburn, Alphonsus Sias, PA-C    Family History No family history on file.  Social History Social History   Tobacco Use  . Smoking status: Never Smoker  . Smokeless tobacco: Never Used  Substance Use Topics  . Alcohol use: Yes    Comment: occasionally  . Drug use: Not on file     Allergies   Patient has no known allergies.   Review of Systems Review of Systems  Musculoskeletal:       Right sided chest pain  All other systems reviewed and are negative.    Physical Exam Updated Vital Signs BP (!) 130/97   Pulse 81   Temp (!) 97.4 F  (36.3 C) (Temporal)   Resp (!) 30   Ht 5\' 9"  (1.753 m)   Wt 86.2 kg (190 lb)   SpO2 94%   BMI 28.06 kg/m   Physical Exam  Constitutional: He is oriented to person, place, and time. He appears well-developed. He appears distressed.  HENT:  Head: Normocephalic and atraumatic.  Right Ear: External ear normal.  Left Ear: External ear normal.  Nose: Nose normal.  Mouth/Throat: Oropharynx is clear and moist.  Eyes: Pupils are equal, round, and reactive to light. Conjunctivae and EOM are normal.  Neck: Normal range of motion. Neck supple.  Cardiovascular: Normal rate, regular rhythm, normal heart sounds and intact distal pulses.  Pulmonary/Chest: Effort normal and breath sounds normal.  Pain with movement or with inspiration.    Abdominal: Soft. Bowel sounds are normal.  Musculoskeletal: Normal range of motion.  Neurological: He is alert and oriented to person, place, and time.  Skin: Skin is warm. Capillary refill takes less than 2 seconds.  Psychiatric: He has a normal mood and affect. His behavior is normal. Judgment and thought content normal.  Nursing note and vitals reviewed.    ED Treatments / Results  Labs (all labs ordered are listed, but only abnormal results are displayed) Labs Reviewed  COMPREHENSIVE METABOLIC PANEL - Abnormal; Notable for the following components:  Result Value   Glucose, Bld 124 (*)    Total Protein 8.4 (*)    Total Bilirubin 2.9 (*)    All other components within normal limits  CBC WITH DIFFERENTIAL/PLATELET - Abnormal; Notable for the following components:   RBC 5.82 (*)    Abs Immature Granulocytes 0.2 (*)    All other components within normal limits  LIPASE, BLOOD  URINALYSIS, ROUTINE W REFLEX MICROSCOPIC    EKG None  Radiology Ct Head Wo Contrast  Result Date: 07/23/2018 CLINICAL DATA:  Fall from roof.  Neck and back pain EXAM: CT HEAD WITHOUT CONTRAST CT CERVICAL SPINE WITHOUT CONTRAST TECHNIQUE: Multidetector CT imaging of  the head and cervical spine was performed following the standard protocol without intravenous contrast. Multiplanar CT image reconstructions of the cervical spine were also generated. COMPARISON:  None. FINDINGS: CT HEAD FINDINGS Brain: No acute intracranial abnormality. Specifically, no hemorrhage, hydrocephalus, mass lesion, acute infarction, or significant intracranial injury. Vascular: No hyperdense vessel or unexpected calcification. Skull: No acute calvarial abnormality. Sinuses/Orbits: Vessel thickening within the ethmoid air cells. No air-fluid levels. Mastoid air cells clear. Orbital soft tissues unremarkable. Other: Probable old left medial orbital wall blowout fracture. CT CERVICAL SPINE FINDINGS Alignment: Normal Skull base and vertebrae: No acute fracture. No primary bone lesion or focal pathologic process. Soft tissues and spinal canal: No prevertebral fluid or swelling. No visible canal hematoma. Disc levels:  Maintains Upper chest: Pneumothorax noted in the right apex. Other: No acute findings IMPRESSION: No intracranial abnormality. No acute bony abnormality in cervical spine. Small right apical pneumothorax. See further discussion in the chest CT report. Electronically Signed   By: Charlett Nose M.D.   On: 07/23/2018 16:13   Ct Chest W Contrast  Result Date: 07/23/2018 CLINICAL DATA:  Level 2 trauma.  Fall from roof. EXAM: CT CHEST, ABDOMEN, AND PELVIS WITH CONTRAST TECHNIQUE: Multidetector CT imaging of the chest, abdomen and pelvis was performed following the standard protocol during bolus administration of intravenous contrast. CONTRAST:  OMNIPAQUE IOHEXOL 300 MG/ML  SOLN COMPARISON:  None. FINDINGS: CT CHEST FINDINGS Cardiovascular: Normal heart size. No pericardial effusion. No evidence of great vessel injury. Mediastinum/Nodes: Negative for pneumomediastinum or mediastinal hematoma. Lungs/Pleura: Small right pneumothorax, less than 10%. Mild atelectasis in the right more than left  lung. No hemothorax. Musculoskeletal: Anterior right first, second, third, and fourth rib fractures. The first rib fracture is segmental posteriorly and at the costochondral junction. There is up to mild displacement CT ABDOMEN PELVIS FINDINGS Hepatobiliary: Negative Pancreas: Negative Spleen: Negative Adrenals/Urinary Tract: No evidence of adrenal or renal injury. Negative urinary bladder. High-density in the bilateral renal collecting system from early contrast excretion. Simple right renal cyst measuring 5 cm Stomach/Bowel: No evidence of injury.  Appendectomy Vascular/Lymphatic: No evidence of vascular injury. No retroperitoneal hematoma. Reproductive: Negative Other: No ascites or pneumoperitoneum Musculoskeletal: Nondisplaced fracture at the puboacetabular junction on the right Soft tissue contusion superficial to the right greater trochanter. Critical Value/emergent results were called by telephone at the time of interpretation on 07/23/2018 at 4:21 pm to Dr. Jacalyn Lefevre , who verbally acknowledged these results. IMPRESSION: 1. Right first through fourth rib fractures with small right apical pneumothorax, less than 10%. 2. Nondisplaced right puboacetabular junction fracture. 3. No evidence of intra-abdominal injury. 4. Soft tissue contusion lateral to the right hip Electronically Signed   By: Marnee Spring M.D.   On: 07/23/2018 16:21   Ct Cervical Spine Wo Contrast  Result Date: 07/23/2018 CLINICAL DATA:  Fall  from roof.  Neck and back pain EXAM: CT HEAD WITHOUT CONTRAST CT CERVICAL SPINE WITHOUT CONTRAST TECHNIQUE: Multidetector CT imaging of the head and cervical spine was performed following the standard protocol without intravenous contrast. Multiplanar CT image reconstructions of the cervical spine were also generated. COMPARISON:  None. FINDINGS: CT HEAD FINDINGS Brain: No acute intracranial abnormality. Specifically, no hemorrhage, hydrocephalus, mass lesion, acute infarction, or significant  intracranial injury. Vascular: No hyperdense vessel or unexpected calcification. Skull: No acute calvarial abnormality. Sinuses/Orbits: Vessel thickening within the ethmoid air cells. No air-fluid levels. Mastoid air cells clear. Orbital soft tissues unremarkable. Other: Probable old left medial orbital wall blowout fracture. CT CERVICAL SPINE FINDINGS Alignment: Normal Skull base and vertebrae: No acute fracture. No primary bone lesion or focal pathologic process. Soft tissues and spinal canal: No prevertebral fluid or swelling. No visible canal hematoma. Disc levels:  Maintains Upper chest: Pneumothorax noted in the right apex. Other: No acute findings IMPRESSION: No intracranial abnormality. No acute bony abnormality in cervical spine. Small right apical pneumothorax. See further discussion in the chest CT report. Electronically Signed   By: Charlett Nose M.D.   On: 07/23/2018 16:13   Ct Abdomen Pelvis W Contrast  Result Date: 07/23/2018 CLINICAL DATA:  Level 2 trauma.  Fall from roof. EXAM: CT CHEST, ABDOMEN, AND PELVIS WITH CONTRAST TECHNIQUE: Multidetector CT imaging of the chest, abdomen and pelvis was performed following the standard protocol during bolus administration of intravenous contrast. CONTRAST:  OMNIPAQUE IOHEXOL 300 MG/ML  SOLN COMPARISON:  None. FINDINGS: CT CHEST FINDINGS Cardiovascular: Normal heart size. No pericardial effusion. No evidence of great vessel injury. Mediastinum/Nodes: Negative for pneumomediastinum or mediastinal hematoma. Lungs/Pleura: Small right pneumothorax, less than 10%. Mild atelectasis in the right more than left lung. No hemothorax. Musculoskeletal: Anterior right first, second, third, and fourth rib fractures. The first rib fracture is segmental posteriorly and at the costochondral junction. There is up to mild displacement CT ABDOMEN PELVIS FINDINGS Hepatobiliary: Negative Pancreas: Negative Spleen: Negative Adrenals/Urinary Tract: No evidence of adrenal or  renal injury. Negative urinary bladder. High-density in the bilateral renal collecting system from early contrast excretion. Simple right renal cyst measuring 5 cm Stomach/Bowel: No evidence of injury.  Appendectomy Vascular/Lymphatic: No evidence of vascular injury. No retroperitoneal hematoma. Reproductive: Negative Other: No ascites or pneumoperitoneum Musculoskeletal: Nondisplaced fracture at the puboacetabular junction on the right Soft tissue contusion superficial to the right greater trochanter. Critical Value/emergent results were called by telephone at the time of interpretation on 07/23/2018 at 4:21 pm to Dr. Jacalyn Lefevre , who verbally acknowledged these results. IMPRESSION: 1. Right first through fourth rib fractures with small right apical pneumothorax, less than 10%. 2. Nondisplaced right puboacetabular junction fracture. 3. No evidence of intra-abdominal injury. 4. Soft tissue contusion lateral to the right hip Electronically Signed   By: Marnee Spring M.D.   On: 07/23/2018 16:21   Dg Chest Portable 1 View  Result Date: 07/23/2018 CLINICAL DATA:  Fall from 2 story roof with chest pain, initial encounter EXAM: PORTABLE CHEST 1 VIEW COMPARISON:  None. FINDINGS: Cardiac shadow is within normal limits. The lungs are poorly aerated although no infiltrate or pneumothorax is seen. No acute bony abnormality is noted. IMPRESSION: No acute abnormality seen. Electronically Signed   By: Alcide Clever M.D.   On: 07/23/2018 14:33    Procedures Procedures (including critical care time)  Medications Ordered in ED Medications  sodium chloride 0.9 % bolus 1,000 mL (0 mLs Intravenous Stopped 07/23/18 1538)  morphine 4 MG/ML injection 4 mg (4 mg Intravenous Given 07/23/18 1415)  ondansetron (ZOFRAN) injection 4 mg (4 mg Intravenous Given 07/23/18 1413)  iohexol (OMNIPAQUE) 300 MG/ML solution 100 mL (100 mLs Intravenous Contrast Given 07/23/18 1524)  HYDROmorphone (DILAUDID) injection 1 mg (1 mg Intravenous  Given 07/23/18 1551)     Initial Impression / Assessment and Plan / ED Course  I have reviewed the triage vital signs and the nursing notes.  Pertinent labs & imaging results that were available during my care of the patient were reviewed by me and considered in my medical decision making (see chart for details).    Pt was d/w Dr. Donell BeersByerly (trauma) who saw pt and will admit for pain control.  Final Clinical Impressions(s) / ED Diagnoses   Final diagnoses:  Closed fracture of multiple ribs of right side, initial encounter  Traumatic pneumothorax, initial encounter  Closed fracture of other parts of pelvis, initial encounter [facility](HCC)    ED Discharge Orders    None       Jacalyn LefevreHaviland, Rafaela Dinius, MD 07/23/18 [phone]

## 2018-07-24 ENCOUNTER — Inpatient Hospital Stay (HOSPITAL_COMMUNITY): Payer: Self-pay

## 2018-07-24 LAB — CBC
HEMATOCRIT: 43.4 % (ref 39.0–52.0)
HEMOGLOBIN: 13.8 g/dL (ref 13.0–17.0)
MCH: 28.4 pg (ref 26.0–34.0)
MCHC: 31.8 g/dL (ref 30.0–36.0)
MCV: 89.3 fL (ref 78.0–100.0)
Platelets: 281 10*3/uL (ref 150–400)
RBC: 4.86 MIL/uL (ref 4.22–5.81)
RDW: 12.5 % (ref 11.5–15.5)
WBC: 7.5 10*3/uL (ref 4.0–10.5)

## 2018-07-24 LAB — URINALYSIS, ROUTINE W REFLEX MICROSCOPIC
Bilirubin Urine: NEGATIVE
Glucose, UA: NEGATIVE mg/dL
Hgb urine dipstick: NEGATIVE
KETONES UR: NEGATIVE mg/dL
Leukocytes, UA: NEGATIVE
Nitrite: NEGATIVE
PROTEIN: NEGATIVE mg/dL
Specific Gravity, Urine: 1.024 (ref 1.005–1.030)
pH: 6 (ref 5.0–8.0)

## 2018-07-24 LAB — BASIC METABOLIC PANEL
ANION GAP: 8 (ref 5–15)
BUN: 13 mg/dL (ref 6–20)
CHLORIDE: 106 mmol/L (ref 98–111)
CO2: 24 mmol/L (ref 22–32)
Calcium: 8.4 mg/dL — ABNORMAL LOW (ref 8.9–10.3)
Creatinine, Ser: 0.93 mg/dL (ref 0.61–1.24)
GFR calc non Af Amer: >60 mL/min (ref >60)
Glucose, Bld: 113 mg/dL — ABNORMAL HIGH (ref 70–99)
POTASSIUM: 3.9 mmol/L (ref 3.5–5.1)
Sodium: 138 mmol/L (ref 135–145)

## 2018-07-24 MED ORDER — ACETAMINOPHEN 325 MG PO TABS
650.0000 mg | ORAL_TABLET | ORAL | Status: DC
  Administered 2018-07-24 – 2018-07-26 (×13): 650 mg via ORAL
  Filled 2018-07-24 (×14): qty 2

## 2018-07-24 MED ORDER — LIDOCAINE HCL (PF) 1 % IJ SOLN: 0.0000 mL | Freq: Once | INTRAMUSCULAR | Status: DC | PRN

## 2018-07-24 MED ORDER — KETOROLAC TROMETHAMINE 15 MG/ML IJ SOLN
15.0000 mg | Freq: Three times a day (TID) | INTRAMUSCULAR | Status: AC
  Administered 2018-07-24 – 2018-07-25 (×6): 15 mg via INTRAVENOUS
  Filled 2018-07-24 (×6): qty 1

## 2018-07-24 MED ORDER — METHOCARBAMOL 500 MG PO TABS
500.0000 mg | ORAL_TABLET | Freq: Three times a day (TID) | ORAL | Status: DC
  Administered 2018-07-24 – 2018-07-26 (×8): 500 mg via ORAL
  Filled 2018-07-24 (×8): qty 1

## 2018-07-24 NOTE — Progress Notes (Signed)
Patient ID: George Wyatt, male   DOB: 1976/12/10, 42 y.o.   MRN: [id number]       Subjective: Pt states most of his pain is in his chest on the right side from his rib fx.  Eating well with no issues.  Denies SOB or increase WOB  Objective: Vital signs in last 24 hours: Temp:  [97.4 F (36.3 C)-98.2 F (36.8 C)] 98.1 F (36.7 C) (07/29 0345) Pulse Rate:  [68-81] 76 (07/28 2051) Resp:  [15-30] 16 (07/29 0345) BP: (108-132)/(72-97) 111/78 (07/29 0345) SpO2:  [94 %-100 %] 100 % (07/29 0345) Weight:  [86.2 kg (190 lb)] 86.2 kg (190 lb) (07/28 1410) Last BM Date: 07/23/18  Intake/Output from previous day: 07/28 0701 - 07/29 0700 In: 2985.5 [P.O.:118; I.V.:1867.5; IV Piggyback:1000] Out: 425 [Urine:425] Intake/Output this shift: No intake/output data recorded.  PE: Gen: NAD Neck: c-collar removed and c-spine cleared.  nontender and good ROM with no tenderness Heart: regular Lungs: decrease BS on right throughout all fields.  CTA on left side Abd: soft, NT, ND, +Bs Ext: NVI.  Some mild pain in pelvis with movement of his right LE.  Lab Results:  Recent Labs    07/23/18 1403 07/24/18 0432  WBC 10.1 7.5  HGB 16.7 13.8  HCT 51.6 43.4  PLT 386 281   BMET Recent Labs    07/23/18 1403 07/24/18 0432  NA 142 138  K 3.6 3.9  CL 108 106  CO2 22 24  GLUCOSE 124* 113*  BUN 17 13  CREATININE 1.14 0.93  CALCIUM 9.5 8.4*   PT/INR No results for input(s): LABPROT, INR in the last 72 hours. CMP     Component Value Date/Time   NA 138 07/24/2018 0432   K 3.9 07/24/2018 0432   CL 106 07/24/2018 0432   CO2 24 07/24/2018 0432   GLUCOSE 113 (H) 07/24/2018 0432   BUN 13 07/24/2018 0432   CREATININE 0.93 07/24/2018 0432   CALCIUM 8.4 (L) 07/24/2018 0432   PROT 8.4 (H) 07/23/2018 1403   ALBUMIN 4.7 07/23/2018 1403   AST 36 07/23/2018 1403   ALT 32 07/23/2018 1403   ALKPHOS 97 07/23/2018 1403   BILITOT 2.9 (H) 07/23/2018 1403   GFRNONAA >60 07/24/2018 0432   GFRAA >60  07/24/2018 0432   Lipase     Component Value Date/Time   LIPASE 33 07/23/2018 1403       Studies/Results: Dg Elbow Complete Right (3+view)  Result Date: 07/23/2018 CLINICAL DATA:  Right elbow injury.  Fall EXAM: RIGHT ELBOW - COMPLETE 3+ VIEW COMPARISON:  None. FINDINGS: There is no evidence of fracture, dislocation, or joint effusion. There is no evidence of arthropathy or other focal bone abnormality. Soft tissues are unremarkable. IMPRESSION: Negative. Electronically Signed   By: Charlett Nose M.D.   On: 07/23/2018 16:41   Ct Head Wo Contrast  Result Date: 07/23/2018 CLINICAL DATA:  Fall from roof.  Neck and back pain EXAM: CT HEAD WITHOUT CONTRAST CT CERVICAL SPINE WITHOUT CONTRAST TECHNIQUE: Multidetector CT imaging of the head and cervical spine was performed following the standard protocol without intravenous contrast. Multiplanar CT image reconstructions of the cervical spine were also generated. COMPARISON:  None. FINDINGS: CT HEAD FINDINGS Brain: No acute intracranial abnormality. Specifically, no hemorrhage, hydrocephalus, mass lesion, acute infarction, or significant intracranial injury. Vascular: No hyperdense vessel or unexpected calcification. Skull: No acute calvarial abnormality. Sinuses/Orbits: Vessel thickening within the ethmoid air cells. No air-fluid levels. Mastoid air cells clear. Orbital soft tissues unremarkable.  Other: Probable old left medial orbital wall blowout fracture. CT CERVICAL SPINE FINDINGS Alignment: Normal Skull base and vertebrae: No acute fracture. No primary bone lesion or focal pathologic process. Soft tissues and spinal canal: No prevertebral fluid or swelling. No visible canal hematoma. Disc levels:  Maintains Upper chest: Pneumothorax noted in the right apex. Other: No acute findings IMPRESSION: No intracranial abnormality. No acute bony abnormality in cervical spine. Small right apical pneumothorax. See further discussion in the chest CT report.  Electronically Signed   By: Charlett Nose M.D.   On: 07/23/2018 16:13   Ct Chest W Contrast  Result Date: 07/23/2018 CLINICAL DATA:  Level 2 trauma.  Fall from roof. EXAM: CT CHEST, ABDOMEN, AND PELVIS WITH CONTRAST TECHNIQUE: Multidetector CT imaging of the chest, abdomen and pelvis was performed following the standard protocol during bolus administration of intravenous contrast. CONTRAST:  OMNIPAQUE IOHEXOL 300 MG/ML  SOLN COMPARISON:  None. FINDINGS: CT CHEST FINDINGS Cardiovascular: Normal heart size. No pericardial effusion. No evidence of great vessel injury. Mediastinum/Nodes: Negative for pneumomediastinum or mediastinal hematoma. Lungs/Pleura: Small right pneumothorax, less than 10%. Mild atelectasis in the right more than left lung. No hemothorax. Musculoskeletal: Anterior right first, second, third, and fourth rib fractures. The first rib fracture is segmental posteriorly and at the costochondral junction. There is up to mild displacement CT ABDOMEN PELVIS FINDINGS Hepatobiliary: Negative Pancreas: Negative Spleen: Negative Adrenals/Urinary Tract: No evidence of adrenal or renal injury. Negative urinary bladder. High-density in the bilateral renal collecting system from early contrast excretion. Simple right renal cyst measuring 5 cm Stomach/Bowel: No evidence of injury.  Appendectomy Vascular/Lymphatic: No evidence of vascular injury. No retroperitoneal hematoma. Reproductive: Negative Other: No ascites or pneumoperitoneum Musculoskeletal: Nondisplaced fracture at the puboacetabular junction on the right Soft tissue contusion superficial to the right greater trochanter. Critical Value/emergent results were called by telephone at the time of interpretation on 07/23/2018 at 4:21 pm to Dr. Jacalyn Lefevre , who verbally acknowledged these results. IMPRESSION: 1. Right first through fourth rib fractures with small right apical pneumothorax, less than 10%. 2. Nondisplaced right puboacetabular junction  fracture. 3. No evidence of intra-abdominal injury. 4. Soft tissue contusion lateral to the right hip Electronically Signed   By: Marnee Spring M.D.   On: 07/23/2018 16:21   Ct Cervical Spine Wo Contrast  Result Date: 07/23/2018 CLINICAL DATA:  Fall from roof.  Neck and back pain EXAM: CT HEAD WITHOUT CONTRAST CT CERVICAL SPINE WITHOUT CONTRAST TECHNIQUE: Multidetector CT imaging of the head and cervical spine was performed following the standard protocol without intravenous contrast. Multiplanar CT image reconstructions of the cervical spine were also generated. COMPARISON:  None. FINDINGS: CT HEAD FINDINGS Brain: No acute intracranial abnormality. Specifically, no hemorrhage, hydrocephalus, mass lesion, acute infarction, or significant intracranial injury. Vascular: No hyperdense vessel or unexpected calcification. Skull: No acute calvarial abnormality. Sinuses/Orbits: Vessel thickening within the ethmoid air cells. No air-fluid levels. Mastoid air cells clear. Orbital soft tissues unremarkable. Other: Probable old left medial orbital wall blowout fracture. CT CERVICAL SPINE FINDINGS Alignment: Normal Skull base and vertebrae: No acute fracture. No primary bone lesion or focal pathologic process. Soft tissues and spinal canal: No prevertebral fluid or swelling. No visible canal hematoma. Disc levels:  Maintains Upper chest: Pneumothorax noted in the right apex. Other: No acute findings IMPRESSION: No intracranial abnormality. No acute bony abnormality in cervical spine. Small right apical pneumothorax. See further discussion in the chest CT report. Electronically Signed   By: Charlett Nose M.D.  On: 07/23/2018 16:13   Ct Abdomen Pelvis W Contrast  Result Date: 07/23/2018 CLINICAL DATA:  Level 2 trauma.  Fall from roof. EXAM: CT CHEST, ABDOMEN, AND PELVIS WITH CONTRAST TECHNIQUE: Multidetector CT imaging of the chest, abdomen and pelvis was performed following the standard protocol during bolus  administration of intravenous contrast. CONTRAST:  OMNIPAQUE IOHEXOL 300 MG/ML  SOLN COMPARISON:  None. FINDINGS: CT CHEST FINDINGS Cardiovascular: Normal heart size. No pericardial effusion. No evidence of great vessel injury. Mediastinum/Nodes: Negative for pneumomediastinum or mediastinal hematoma. Lungs/Pleura: Small right pneumothorax, less than 10%. Mild atelectasis in the right more than left lung. No hemothorax. Musculoskeletal: Anterior right first, second, third, and fourth rib fractures. The first rib fracture is segmental posteriorly and at the costochondral junction. There is up to mild displacement CT ABDOMEN PELVIS FINDINGS Hepatobiliary: Negative Pancreas: Negative Spleen: Negative Adrenals/Urinary Tract: No evidence of adrenal or renal injury. Negative urinary bladder. High-density in the bilateral renal collecting system from early contrast excretion. Simple right renal cyst measuring 5 cm Stomach/Bowel: No evidence of injury.  Appendectomy Vascular/Lymphatic: No evidence of vascular injury. No retroperitoneal hematoma. Reproductive: Negative Other: No ascites or pneumoperitoneum Musculoskeletal: Nondisplaced fracture at the puboacetabular junction on the right Soft tissue contusion superficial to the right greater trochanter. Critical Value/emergent results were called by telephone at the time of interpretation on 07/23/2018 at 4:21 pm to Dr. Jacalyn Lefevre , who verbally acknowledged these results. IMPRESSION: 1. Right first through fourth rib fractures with small right apical pneumothorax, less than 10%. 2. Nondisplaced right puboacetabular junction fracture. 3. No evidence of intra-abdominal injury. 4. Soft tissue contusion lateral to the right hip Electronically Signed   By: Marnee Spring M.D.   On: 07/23/2018 16:21   Dg Chest Port 1 View  Addendum Date: 07/24/2018   ADDENDUM REPORT: 07/24/2018 07:51 ADDENDUM: Critical Value/emergent results were called by telephone at the time of  interpretation on 07/24/2018 at 0740 hours to Nurse Kathlene November, who verbally acknowledged these results. Electronically Signed   By: Odessa Fleming M.D.   On: 07/24/2018 07:51   Result Date: 07/24/2018 CLINICAL DATA:  42 year old male with right chest pain. Fall from roof yesterday with right 1st through 4th rib fractures and small pneumothorax on presentation chest CT. EXAM: PORTABLE CHEST 1 VIEW COMPARISON:  Chest CT and radiographs 07/23/2018. FINDINGS: Portable AP semi upright view at 0615 hours. Anterior right rib fractures were better demonstrated by CT. Progressed right side pneumothorax since yesterday, now moderate in size. Mild patchy opacity in the partially collapsed right lung compatible with small contusion seen yesterday. No right pleural effusion is evident. Lung volumes remain low. Mediastinal contours are stable. Aside from mild basilar atelectasis the left lung appears clear. Visualized tracheal air column is within normal limits. Negative visible bowel gas pattern. IMPRESSION: 1. Increased right pneumothorax since yesterday, now moderate in size. 2. Small right lung pulmonary contusions. Mild left lung atelectasis. Electronically Signed: By: Odessa Fleming M.D. On: 07/24/2018 07:33   Dg Chest Portable 1 View  Result Date: 07/23/2018 CLINICAL DATA:  Fall from 2 story roof with chest pain, initial encounter EXAM: PORTABLE CHEST 1 VIEW COMPARISON:  None. FINDINGS: Cardiac shadow is within normal limits. The lungs are poorly aerated although no infiltrate or pneumothorax is seen. No acute bony abnormality is noted. IMPRESSION: No acute abnormality seen. Electronically Signed   By: Alcide Clever M.D.   On: 07/23/2018 14:33    Anti-infectives: Anti-infectives (From admission, onward)   None  Assessment/Plan Fall Right rib fractures 1-4 - pulm toilet and pain control Small right ptx -  Increased in size.  May require CT today.  On [place] at 2L.  Repeat film in am Pelvic fx (right  puboacetabular jxn) - evaluated by Dr. Aundria Rudogers with ortho.  WBAT, follow up in 2-3 weeks.  PT/OT evals Right elbow pain - films negative, pain control Elevated T bili - unclear etiology.  CT negative for hepatobiliary findings. FEN - regular diet, scheduled and prn pain medications  VTE - Lovenox/SCDs ID - None   LOS: 1 day    Letha CapeKelly E Daley Gosse , [facility] 07/24/2018, 8:00 AM Pager: [phone]

## 2018-07-24 NOTE — Procedures (Signed)
Chest Tube Insertion Procedure Note  Indications:  Clinically significant Pneumothorax on the right side secondary to rib fractures sustained from a fall.  Pre-operative Diagnosis: Right Pneumothorax  Post-operative Diagnosis: Right Pneumothorax  Procedure Details  Informed consent was obtained for the procedure, including sedation.  Risks of lung perforation, hemorrhage, arrhythmia, and adverse drug reaction were discussed.   After sterile skin prep, using standard technique, a 14 French tube was placed in the right anterior lateral rib space.  Findings: Significant air was evacuated with tube placement  Estimated Blood Loss:  Minimal         Specimens:  None              Complications:  None; patient tolerated the procedure well.         Disposition: Progressive Floor monitored         Condition: stable  Attending Attestation:   Letha CapeKelly E Adam Demary 3:30 PM 07/24/2018

## 2018-07-25 ENCOUNTER — Inpatient Hospital Stay (HOSPITAL_COMMUNITY): Payer: Self-pay

## 2018-07-25 LAB — BASIC METABOLIC PANEL
Anion gap: 5 (ref 5–15)
BUN: 11 mg/dL (ref 6–20)
CO2: 28 mmol/L (ref 22–32)
Calcium: 8.6 mg/dL — ABNORMAL LOW (ref 8.9–10.3)
Chloride: 106 mmol/L (ref 98–111)
Creatinine, Ser: 1.08 mg/dL (ref 0.61–1.24)
GFR calc Af Amer: >60 mL/min (ref >60)
Glucose, Bld: 109 mg/dL — ABNORMAL HIGH (ref 70–99)
Potassium: 4.5 mmol/L (ref 3.5–5.1)
Sodium: 139 mmol/L (ref 135–145)

## 2018-07-25 MED ORDER — SODIUM CHLORIDE 0.9 % IV SOLN
INTRAVENOUS | Status: DC
  Administered 2018-07-25: 23:00:00 via INTRAVENOUS

## 2018-07-25 MED ORDER — SODIUM CHLORIDE 0.9 % IV SOLN
INTRAVENOUS | Status: DC
  Administered 2018-07-25: 11:00:00 via INTRAVENOUS
  Filled 2018-07-25 (×2): qty 1000

## 2018-07-25 NOTE — Progress Notes (Signed)
Patient ID: George Wyatt, male   DOB: [birth year], 42 y.o.   MRN: [id number]       Subjective: Patient with no complaints this morning.  No SOB.  Hasn't mobilized yet.  Eating well.  Objective: Vital signs in last 24 hours: Temp:  [97.7 F (36.5 C)-98.5 F (36.9 C)] 97.7 F (36.5 C) (07/30 0300) Pulse Rate:  [70-76] 76 (07/30 0300) Resp:  [14-18] 18 (07/30 0300) BP: (97-114)/(62-83) 98/62 (07/30 0300) SpO2:  [99 %-100 %] 100 % (07/30 0300) Last BM Date: 07/23/18  Intake/Output from previous day: 07/29 0701 - 07/30 0700 In: 240 [P.O.:240] Out: 575 [Urine:575] Intake/Output this shift: No intake/output data recorded.  PE: Gen: NAD Heart: regular Lungs: CTAB, good air movement after CT placement on right side.  CT with no air leak and about 50-60cc of serosang output since placement Abd: soft, NT, ND, +BS Ext: NVI  Lab Results:  Recent Labs    07/23/18 1403 07/24/18 0432  WBC 10.1 7.5  HGB 16.7 13.8  HCT 51.6 43.4  PLT 386 281   BMET Recent Labs    07/24/18 0432 07/25/18 0348  NA 138 139  K 3.9 4.5  CL 106 106  CO2 24 28  GLUCOSE 113* 109*  BUN 13 11  CREATININE 0.93 1.08  CALCIUM 8.4* 8.6*   PT/INR No results for input(s): LABPROT, INR in the last 72 hours. CMP     Component Value Date/Time   NA 139 07/25/2018 0348   K 4.5 07/25/2018 0348   CL 106 07/25/2018 0348   CO2 28 07/25/2018 0348   GLUCOSE 109 (H) 07/25/2018 0348   BUN 11 07/25/2018 0348   CREATININE 1.08 07/25/2018 0348   CALCIUM 8.6 (L) 07/25/2018 0348   PROT 8.4 (H) 07/23/2018 1403   ALBUMIN 4.7 07/23/2018 1403   AST 36 07/23/2018 1403   ALT 32 07/23/2018 1403   ALKPHOS 97 07/23/2018 1403   BILITOT 2.9 (H) 07/23/2018 1403   GFRNONAA >60 07/25/2018 0348   GFRAA >60 07/25/2018 0348   Lipase     Component Value Date/Time   LIPASE 33 07/23/2018 1403       Studies/Results: Dg Elbow Complete Right (3+view)  Result Date: 07/23/2018 CLINICAL DATA:  Right elbow injury.  Fall  EXAM: RIGHT ELBOW - COMPLETE 3+ VIEW COMPARISON:  None. FINDINGS: There is no evidence of fracture, dislocation, or joint effusion. There is no evidence of arthropathy or other focal bone abnormality. Soft tissues are unremarkable. IMPRESSION: Negative. Electronically Signed   By: Charlett Nose M.D.   On: 07/23/2018 16:41   Ct Head Wo Contrast  Result Date: 07/23/2018 CLINICAL DATA:  Fall from roof.  Neck and back pain EXAM: CT HEAD WITHOUT CONTRAST CT CERVICAL SPINE WITHOUT CONTRAST TECHNIQUE: Multidetector CT imaging of the head and cervical spine was performed following the standard protocol without intravenous contrast. Multiplanar CT image reconstructions of the cervical spine were also generated. COMPARISON:  None. FINDINGS: CT HEAD FINDINGS Brain: No acute intracranial abnormality. Specifically, no hemorrhage, hydrocephalus, mass lesion, acute infarction, or significant intracranial injury. Vascular: No hyperdense vessel or unexpected calcification. Skull: No acute calvarial abnormality. Sinuses/Orbits: Vessel thickening within the ethmoid air cells. No air-fluid levels. Mastoid air cells clear. Orbital soft tissues unremarkable. Other: Probable old left medial orbital wall blowout fracture. CT CERVICAL SPINE FINDINGS Alignment: Normal Skull base and vertebrae: No acute fracture. No primary bone lesion or focal pathologic process. Soft tissues and spinal canal: No prevertebral fluid or swelling. No visible canal  hematoma. Disc levels:  Maintains Upper chest: Pneumothorax noted in the right apex. Other: No acute findings IMPRESSION: No intracranial abnormality. No acute bony abnormality in cervical spine. Small right apical pneumothorax. See further discussion in the chest CT report. Electronically Signed   By: Charlett Nose M.D.   On: 07/23/2018 16:13   Ct Chest W Contrast  Result Date: 07/23/2018 CLINICAL DATA:  Level 2 trauma.  Fall from roof. EXAM: CT CHEST, ABDOMEN, AND PELVIS WITH CONTRAST  TECHNIQUE: Multidetector CT imaging of the chest, abdomen and pelvis was performed following the standard protocol during bolus administration of intravenous contrast. CONTRAST:  OMNIPAQUE IOHEXOL 300 MG/ML  SOLN COMPARISON:  None. FINDINGS: CT CHEST FINDINGS Cardiovascular: Normal heart size. No pericardial effusion. No evidence of great vessel injury. Mediastinum/Nodes: Negative for pneumomediastinum or mediastinal hematoma. Lungs/Pleura: Small right pneumothorax, less than 10%. Mild atelectasis in the right more than left lung. No hemothorax. Musculoskeletal: Anterior right first, second, third, and fourth rib fractures. The first rib fracture is segmental posteriorly and at the costochondral junction. There is up to mild displacement CT ABDOMEN PELVIS FINDINGS Hepatobiliary: Negative Pancreas: Negative Spleen: Negative Adrenals/Urinary Tract: No evidence of adrenal or renal injury. Negative urinary bladder. High-density in the bilateral renal collecting system from early contrast excretion. Simple right renal cyst measuring 5 cm Stomach/Bowel: No evidence of injury.  Appendectomy Vascular/Lymphatic: No evidence of vascular injury. No retroperitoneal hematoma. Reproductive: Negative Other: No ascites or pneumoperitoneum Musculoskeletal: Nondisplaced fracture at the puboacetabular junction on the right Soft tissue contusion superficial to the right greater trochanter. Critical Value/emergent results were called by telephone at the time of interpretation on 07/23/2018 at 4:21 pm to Dr. Jacalyn Lefevre , who verbally acknowledged these results. IMPRESSION: 1. Right first through fourth rib fractures with small right apical pneumothorax, less than 10%. 2. Nondisplaced right puboacetabular junction fracture. 3. No evidence of intra-abdominal injury. 4. Soft tissue contusion lateral to the right hip Electronically Signed   By: Marnee Spring M.D.   On: 07/23/2018 16:21   Ct Cervical Spine Wo Contrast  Result  Date: 07/23/2018 CLINICAL DATA:  Fall from roof.  Neck and back pain EXAM: CT HEAD WITHOUT CONTRAST CT CERVICAL SPINE WITHOUT CONTRAST TECHNIQUE: Multidetector CT imaging of the head and cervical spine was performed following the standard protocol without intravenous contrast. Multiplanar CT image reconstructions of the cervical spine were also generated. COMPARISON:  None. FINDINGS: CT HEAD FINDINGS Brain: No acute intracranial abnormality. Specifically, no hemorrhage, hydrocephalus, mass lesion, acute infarction, or significant intracranial injury. Vascular: No hyperdense vessel or unexpected calcification. Skull: No acute calvarial abnormality. Sinuses/Orbits: Vessel thickening within the ethmoid air cells. No air-fluid levels. Mastoid air cells clear. Orbital soft tissues unremarkable. Other: Probable old left medial orbital wall blowout fracture. CT CERVICAL SPINE FINDINGS Alignment: Normal Skull base and vertebrae: No acute fracture. No primary bone lesion or focal pathologic process. Soft tissues and spinal canal: No prevertebral fluid or swelling. No visible canal hematoma. Disc levels:  Maintains Upper chest: Pneumothorax noted in the right apex. Other: No acute findings IMPRESSION: No intracranial abnormality. No acute bony abnormality in cervical spine. Small right apical pneumothorax. See further discussion in the chest CT report. Electronically Signed   By: Charlett Nose M.D.   On: 07/23/2018 16:13   Ct Abdomen Pelvis W Contrast  Result Date: 07/23/2018 CLINICAL DATA:  Level 2 trauma.  Fall from roof. EXAM: CT CHEST, ABDOMEN, AND PELVIS WITH CONTRAST TECHNIQUE: Multidetector CT imaging of the chest, abdomen and  pelvis was performed following the standard protocol during bolus administration of intravenous contrast. CONTRAST:  OMNIPAQUE IOHEXOL 300 MG/ML  SOLN COMPARISON:  None. FINDINGS: CT CHEST FINDINGS Cardiovascular: Normal heart size. No pericardial effusion. No evidence of great vessel  injury. Mediastinum/Nodes: Negative for pneumomediastinum or mediastinal hematoma. Lungs/Pleura: Small right pneumothorax, less than 10%. Mild atelectasis in the right more than left lung. No hemothorax. Musculoskeletal: Anterior right first, second, third, and fourth rib fractures. The first rib fracture is segmental posteriorly and at the costochondral junction. There is up to mild displacement CT ABDOMEN PELVIS FINDINGS Hepatobiliary: Negative Pancreas: Negative Spleen: Negative Adrenals/Urinary Tract: No evidence of adrenal or renal injury. Negative urinary bladder. High-density in the bilateral renal collecting system from early contrast excretion. Simple right renal cyst measuring 5 cm Stomach/Bowel: No evidence of injury.  Appendectomy Vascular/Lymphatic: No evidence of vascular injury. No retroperitoneal hematoma. Reproductive: Negative Other: No ascites or pneumoperitoneum Musculoskeletal: Nondisplaced fracture at the puboacetabular junction on the right Soft tissue contusion superficial to the right greater trochanter. Critical Value/emergent results were called by telephone at the time of interpretation on 07/23/2018 at 4:21 pm to Dr. Jacalyn Lefevre , who verbally acknowledged these results. IMPRESSION: 1. Right first through fourth rib fractures with small right apical pneumothorax, less than 10%. 2. Nondisplaced right puboacetabular junction fracture. 3. No evidence of intra-abdominal injury. 4. Soft tissue contusion lateral to the right hip Electronically Signed   By: Marnee Spring M.D.   On: 07/23/2018 16:21   Dg Chest Port 1 View  Result Date: 07/24/2018 CLINICAL DATA:  Follow-up pneumothorax following chest tube placement EXAM: PORTABLE CHEST 1 VIEW COMPARISON:  Film from earlier in the same day. FINDINGS: Cardiac shadow is stable. Pigtail catheter is now seen on the right with resolution of the previously seen pneumothorax. Mild right basilar atelectasis is noted. The left lung remains clear.  No bony abnormality is seen. IMPRESSION: Resolution of previously seen right pneumothorax. Mild right basilar atelectasis is noted. Electronically Signed   By: Alcide Clever M.D.   On: 07/24/2018 16:16   Dg Chest Port 1 View  Addendum Date: 07/24/2018   ADDENDUM REPORT: 07/24/2018 07:51 ADDENDUM: Critical Value/emergent results were called by telephone at the time of interpretation on 07/24/2018 at 0740 hours to Nurse Kathlene November, who verbally acknowledged these results. Electronically Signed   By: Odessa Fleming M.D.   On: 07/24/2018 07:51   Result Date: 07/24/2018 CLINICAL DATA:  42 year old male with right chest pain. Fall from roof yesterday with right 1st through 4th rib fractures and small pneumothorax on presentation chest CT. EXAM: PORTABLE CHEST 1 VIEW COMPARISON:  Chest CT and radiographs 07/23/2018. FINDINGS: Portable AP semi upright view at 0615 hours. Anterior right rib fractures were better demonstrated by CT. Progressed right side pneumothorax since yesterday, now moderate in size. Mild patchy opacity in the partially collapsed right lung compatible with small contusion seen yesterday. No right pleural effusion is evident. Lung volumes remain low. Mediastinal contours are stable. Aside from mild basilar atelectasis the left lung appears clear. Visualized tracheal air column is within normal limits. Negative visible bowel gas pattern. IMPRESSION: 1. Increased right pneumothorax since yesterday, now moderate in size. 2. Small right lung pulmonary contusions. Mild left lung atelectasis. Electronically Signed: By: Odessa Fleming M.D. On: 07/24/2018 07:33   Dg Chest Portable 1 View  Result Date: 07/23/2018 CLINICAL DATA:  Fall from 2 story roof with chest pain, initial encounter EXAM: PORTABLE CHEST 1 VIEW COMPARISON:  None. FINDINGS:  Cardiac shadow is within normal limits. The lungs are poorly aerated although no infiltrate or pneumothorax is seen. No acute bony abnormality is noted. IMPRESSION: No acute  abnormality seen. Electronically Signed   By: Alcide CleverMark  Lukens M.D.   On: 07/23/2018 14:33    Anti-infectives: Anti-infectives (From admission, onward)   None       Assessment/Plan Fall Right rib fractures 1-4 - pulm toilet and pain control Small right ptx -  Increased in size, right CT placed yesterday with resolution.  CT to waterseal today.  Repeat CXR in am. Pelvic fx (right puboacetabular jxn) - evaluated by Dr. Aundria Rudogers with ortho.  WBAT, follow up in 2-3 weeks.  PT/OT evals Right elbow pain - films negative, pain control Elevated T bili - unclear etiology.  CT negative for hepatobiliary findings. FEN - regular diet, scheduled and prn pain medications  VTE - Lovenox/SCDs ID - None    LOS: 2 days    Letha CapeKelly E Ryliegh Mcduffey , [facility] 07/25/2018, 7:59 AM Pager: [phone]

## 2018-07-25 NOTE — Evaluation (Signed)
Physical Therapy Evaluation Patient Details Name: George Wyatt MRN: [id number] DOB: [birth year] Today's Date: 07/25/2018   History of Present Illness  George Crazedgar Jares is a 42 y.o. male arrived to the ED as a level 2 trauma. Pt sustained right rib fractures 1-4 and pelvic fracture (right puboacetabular jxn)  following a fall from approximately 30 to 40 feet from a ladder at work. Pt s/p right pneumothorax 7/29.  Clinical Impression  PTA pt living independently with girlfriend in single story home with 2 steps to enter and independent in mobility and ADLs. Pt limited in his safe mobility by pain with movement in his ribs, and decreased endurance due to pain. Pt currently min guard for bed mobility, transfers without AD and stepping to recliner. Pt will benefit from Acute PT to progress mobility to d/c home without need for further therapy.     Follow Up Recommendations No PT follow up    Equipment Recommendations  Rolling walker with 5" wheels       Precautions / Restrictions Precautions Precautions: None Precaution Comments: chest tube Restrictions Weight Bearing Restrictions: Yes RLE Weight Bearing: Weight bearing as tolerated LLE Weight Bearing: Weight bearing as tolerated      Mobility  Bed Mobility Overal bed mobility: Needs Assistance Bed Mobility: Supine to Sit     Supine to sit: Min guard;HOB elevated     General bed mobility comments: min guard for safety   Transfers Overall transfer level: Needs assistance Equipment used: None Transfers: Sit to/from Stand Sit to Stand: Min guard         General transfer comment: min guard for safety with standing   Ambulation/Gait Ambulation/Gait assistance: Min guard Gait Distance (Feet): 2 Feet Assistive device: None Gait Pattern/deviations: Step-to pattern Gait velocity: slowed Gait velocity interpretation: <1.31 ft/sec, indicative of household ambulator General Gait Details: min guard for safety, with stepping to  Best boyrecliner  Stairs            Wheelchair Mobility    Modified Rankin (Stroke Patients Only)       Balance Overall balance assessment: Mild deficits observed, not formally tested                                           Pertinent Vitals/Pain Pain Assessment: Faces Faces Pain Scale: Hurts a little bit Pain Location: chest/ribs Pain Descriptors / Indicators: Aching;Sore;Grimacing Pain Intervention(s): Limited activity within patient's tolerance;Monitored during session;Repositioned    Home Living Family/patient expects to be discharged to:: Private residence Living Arrangements: Spouse/significant other;Other relatives Available Help at Discharge: Family Type of Home: House Home Access: Stairs to enter Entrance Stairs-Rails: Right;Left;Can reach both Secretary/administratorntrance Stairs-Number of Steps: 2 Home Layout: One level Home Equipment: None      Prior Function Level of Independence: Independent         Comments: working Holiday representativeconstruction; Theatre stage managerdrives     Hand Dominance   Dominant Hand: Right    Extremity/Trunk Assessment   Upper Extremity Assessment Upper Extremity Assessment: Defer to OT evaluation    Lower Extremity Assessment Lower Extremity Assessment: RLE deficits/detail;LLE deficits/detail    Cervical / Trunk Assessment Cervical / Trunk Assessment: Normal  Communication   Communication: Prefers language other than English(Spanish)  Cognition Arousal/Alertness: Awake/alert Behavior During Therapy: WFL for tasks assessed/performed  General Comments: States he did not lose consciousness      General Comments General comments (skin integrity, edema, etc.): e-translator brought to room, however pt stated he didn't need it, able to understand questions and provide appropriate response. BP stable with movement 112/82. Wife present throughout session     Exercises     Assessment/Plan    PT  Assessment Patient needs continued PT services  PT Problem List Pain;Decreased activity tolerance       PT Treatment Interventions Gait training;Stair training;Functional mobility training;Therapeutic activities;Therapeutic exercise;Balance training;Patient/family education    PT Goals (Current goals can be found in the Care Plan section)  Acute Rehab PT Goals Patient Stated Goal: to go home PT Goal Formulation: With patient Time For Goal Achievement: 08/08/18 Potential to Achieve Goals: Good    Frequency Min 3X/week    AM-PAC PT "6 Clicks" Daily Activity  Outcome Measure Difficulty turning over in bed (including adjusting bedclothes, sheets and blankets)?: A Little Difficulty moving from lying on back to sitting on the side of the bed? : Unable Difficulty sitting down on and standing up from a chair with arms (e.g., wheelchair, bedside commode, etc,.)?: Unable Help needed moving to and from a bed to chair (including a wheelchair)?: A Little Help needed walking in hospital room?: A Little Help needed climbing 3-5 steps with a railing? : A Lot 6 Click Score: 13    End of Session Equipment Utilized During Treatment: Gait belt Activity Tolerance: Patient limited by pain Patient left: in chair;with call bell/phone within reach;with family/visitor present Nurse Communication: Mobility status PT Visit Diagnosis: Unsteadiness on feet (R26.81);Difficulty in walking, not elsewhere classified (R26.2);Pain Pain - part of body: (ribs)    Time: 1601-1630 PT Time Calculation (min) (ACUTE ONLY): 29 min   Charges:   PT Evaluation $PT Eval Moderate Complexity: 1 Mod PT Treatments $Therapeutic Activity: 8-22 mins        Samiyah Stupka B. Beverely Risen PT, DPT Acute Rehabilitation  [phone] Pager [phone]    Elon Alas Fleet 07/25/2018, 5:17 PM

## 2018-07-25 NOTE — Progress Notes (Signed)
Occupational Therapy Evaluation Patient Details Name: George Wyatt MRN: [id number] DOB: 1976-08-31 Today's Date: 07/25/2018    History of Present Illness George Wyatt is a 42 y.o. male arrived to the ED as a level 2 trauma. Pt sustained right rib fractures 1-4 and pelvic fracture (right puboacetabular jxn)  following a fall from approximately 30 to 40 feet from a ladder at work. Pt s/p right pneumothorax 7/29.      Clinical Impression   Translator used during session: Victorino Dike [phone]. PTA, pt was living at home with his girlfriend and family, and was independent with ADLs and his work consists of placing shingles. Pt reports he did not hit his head with the fall. Pt currently requires minA for functional mobility and minguard-minA for ADLs. Due to deficits listed below (see OT problem list), pt would benefit from acute OT to address establish goals to facilitate safe D/C home without follow-up OT.  BP Supine 103/76 Sitting EOB 113/[birth year] Standing 110/77    Follow Up Recommendations  No OT follow up;Supervision - Intermittent    Equipment Recommendations  None recommended by OT    Recommendations for Other Services PT consult     Precautions / Restrictions        Mobility Bed Mobility Overal bed mobility: Needs Assistance Bed Mobility: Supine to Sit     Supine to sit: Min guard;HOB elevated        Transfers Overall transfer level: Needs assistance Equipment used: None Transfers: Sit to/from Stand Sit to Stand: Min assist         General transfer comment: minA for powerup and stability with initial stand    Balance Overall balance assessment: Mild deficits observed, not formally tested                                         ADL either performed or assessed with clinical judgement   ADL Overall ADL's : Needs assistance/impaired Eating/Feeding: Set up;Sitting   Grooming: Min guard;Standing   Upper Body Bathing: Min guard;Standing    Lower Body Bathing: Minimal assistance;Sit to/from stand   Upper Body Dressing : Min guard;Standing   Lower Body Dressing: Minimal assistance;Sit to/from stand Lower Body Dressing Details (indicate cue type and reason): pt required assistance to don sock over toes on R foot, otherwise indpendent Toilet Transfer: Minimal assistance   Toileting- Clothing Manipulation and Hygiene: Minimal assistance;Sit to/from stand   Tub/ Engineer, structural: Minimal Radiation protection practitioner Details (indicate cue type and reason): simulated step over high tub, pt has good hip flexion to get over tub;minA for stability Functional mobility during ADLs: Minimal assistance       Vision Baseline Vision/History: No visual deficits       Perception     Praxis      Pertinent Vitals/Pain Pain Assessment: No/denies pain     Hand Dominance Right   Extremity/Trunk Assessment Upper Extremity Assessment Upper Extremity Assessment: Overall WFL for tasks assessed   Lower Extremity Assessment Lower Extremity Assessment: Defer to PT evaluation   Cervical / Trunk Assessment Cervical / Trunk Assessment: Normal   Communication Communication Communication: Prefers language other than English(Spainish)   Cognition Arousal/Alertness: Awake/alert Behavior During Therapy: WFL for tasks assessed/performed  General Comments: States he did not lose consciousness   General Comments  translator used via video, Victorino DikeJennifer [phone];pt understands minimal english but prefers use of translator with staff;per pt request, changed bed linens while he was standing due to increased pain when rolling side to side;    Exercises     Shoulder Instructions      Home Living Family/patient expects to be discharged to:: Private residence Living Arrangements: Spouse/significant other;Other relatives Available Help at Discharge: Family Type of Home: House Home Access: Stairs  to enter Entergy CorporationEntrance Stairs-Number of Steps: 2 Entrance Stairs-Rails: Right;Left;Can reach both Home Layout: One level     Bathroom Shower/Tub: Tub/shower unit;Walk-in shower   Bathroom Toilet: Standard Bathroom Accessibility: Yes How Accessible: Accessible via walker Home Equipment: None          Prior Functioning/Environment Level of Independence: Independent        Comments: working Holiday representativeconstruction; drives        OT Problem List: Decreased range of motion;Decreased activity tolerance;Cardiopulmonary status limiting activity      OT Treatment/Interventions: Self-care/ADL training;Therapeutic exercise;Energy conservation;Therapeutic activities;Balance training;Patient/family education    OT Goals(Current goals can be found in the care plan section) Acute Rehab OT Goals Patient Stated Goal: to go home OT Goal Formulation: With patient Time For Goal Achievement: 08/08/18 Potential to Achieve Goals: Good ADL Goals Pt Will Perform Grooming: Independently Pt Will Transfer to Toilet: Independently Pt Will Perform Tub/Shower Transfer: Independently;ambulating;Tub transfer  OT Frequency: Min 2X/week   Barriers to D/C:            Co-evaluation              AM-PAC PT "6 Clicks" Daily Activity     Outcome Measure Help from another person eating meals?: None Help from another person taking care of personal grooming?: A Little Help from another person toileting, which includes using toliet, bedpan, or urinal?: A Little Help from another person bathing (including washing, rinsing, drying)?: A Little Help from another person to put on and taking off regular upper body clothing?: A Little Help from another person to put on and taking off regular lower body clothing?: A Little 6 Click Score: 19   End of Session Equipment Utilized During Treatment: Gait belt Nurse Communication: Mobility status  Activity Tolerance: Patient tolerated treatment well Patient left: in  bed;with call bell/phone within reach  OT Visit Diagnosis: Unsteadiness on feet (R26.81);Muscle weakness (generalized) (M62.81);Other abnormalities of gait and mobility (R26.89)                Time: 5409-81191416-1451 OT Time Calculation (min): 35 min Charges:     Diona Brownereresa Thompson OTS    Diona Brownereresa Thompson 07/25/2018, 4:44 PM

## 2018-07-25 NOTE — Progress Notes (Signed)
OT Note - Addendum    07/25/18 1600  OT Visit Information  Last OT Received On 07/25/18  OT Time Calculation  OT Start Time (ACUTE ONLY) 1416  OT Stop Time (ACUTE ONLY) 1451  OT Time Calculation (min) 35 min  OT General Charges  $OT Visit 1 Visit  OT Evaluation  $OT Eval Moderate Complexity 1 Mod  OT Treatments  $Self Care/Home Management  8-22 mins  Luisa DagoHilary Tauri Ethington, OT/L  OT Clinical Specialist [phone]

## 2018-07-26 ENCOUNTER — Inpatient Hospital Stay (HOSPITAL_COMMUNITY): Payer: Self-pay

## 2018-07-26 LAB — CBC
HEMATOCRIT: 40.6 % (ref 39.0–52.0)
HEMOGLOBIN: 13.2 g/dL (ref 13.0–17.0)
MCH: 28.8 pg (ref 26.0–34.0)
MCHC: 32.5 g/dL (ref 30.0–36.0)
MCV: 88.6 fL (ref 78.0–100.0)
Platelets: 274 10*3/uL (ref 150–400)
RBC: 4.58 MIL/uL (ref 4.22–5.81)
RDW: 12.6 % (ref 11.5–15.5)
WBC: 7.6 10*3/uL (ref 4.0–10.5)

## 2018-07-26 LAB — BASIC METABOLIC PANEL
Anion gap: 7 (ref 5–15)
BUN: 8 mg/dL (ref 6–20)
CO2: 25 mmol/L (ref 22–32)
CREATININE: 0.85 mg/dL (ref 0.61–1.24)
Calcium: 8.3 mg/dL — ABNORMAL LOW (ref 8.9–10.3)
Chloride: 107 mmol/L (ref 98–111)
GFR calc Af Amer: >60 mL/min (ref >60)
GLUCOSE: 107 mg/dL — AB (ref 70–99)
POTASSIUM: 3.8 mmol/L (ref 3.5–5.1)
Sodium: 139 mmol/L (ref 135–145)

## 2018-07-26 MED ORDER — TRAMADOL HCL 50 MG PO TABS
50.0000 mg | ORAL_TABLET | Freq: Four times a day (QID) | ORAL | Status: DC
  Administered 2018-07-26: 50 mg via ORAL
  Filled 2018-07-26: qty 1

## 2018-07-26 MED ORDER — TRAMADOL HCL 50 MG PO TABS: 50.0000 mg | ORAL_TABLET | Freq: Four times a day (QID) | ORAL | 0 refills | Status: AC | PRN

## 2018-07-26 MED ORDER — OXYCODONE HCL 5 MG PO TABS
5.0000 mg | ORAL_TABLET | Freq: Four times a day (QID) | ORAL | 0 refills | Status: AC | PRN
Start: 2018-07-26 — End: ?

## 2018-07-26 NOTE — Progress Notes (Signed)
OT Note - Addendum    07/26/18 1629  OT Visit Information  Last OT Received On 07/26/18  OT Time Calculation  OT Start Time (ACUTE ONLY) 1540  OT Stop Time (ACUTE ONLY) 1559  OT Time Calculation (min) 19 min  OT Treatments  $Self Care/Home Management  8-22 mins  Luisa DagoHilary Camiya Vinal, OT/L  OT Clinical Specialist [phone]

## 2018-07-26 NOTE — Progress Notes (Signed)
Occupational Therapy Treatment Patient Details Name: George Wyatt MRN: [id number] DOB: [birth year] Today's Date: 07/26/2018    History of present illness George Wyatt is a 42 y.o. male arrived to the ED as a level 2 trauma. Pt sustained right rib fractures 1-4 and pelvic fracture (right puboacetabular jxn)  following a fall from approximately 30 to 40 feet from a ladder at work. Pt s/p right pneumothorax 7/29.   OT comments  E-translator used during session. Pt demonstrated great progress toward baseline. Pt is currently modified independent with completion of ADLs and functional mobility. Pt demonstrates good balance with higher level dynamic activities. All education has been completed and the patient/significant other have no further questions. No additional OT needs identified. Pt to d/c when medically stable.    Follow Up Recommendations  No OT follow up    Equipment Recommendations  None recommended by OT    Recommendations for Other Services      Precautions / Restrictions Precautions Precautions: None Restrictions Weight Bearing Restrictions: Yes RLE Weight Bearing: Weight bearing as tolerated LLE Weight Bearing: Weight bearing as tolerated       Mobility Bed Mobility Overal bed mobility: Modified Independent                Transfers Overall transfer level: Modified independent                    Balance Overall balance assessment: Modified Independent                             High Level Balance Comments: pt demonstrated good balance during high level balance activities           ADL either performed or assessed with clinical judgement   ADL Overall ADL's : Modified independent                                       General ADL Comments: pt demonstrated higher level functional mobility in the room with mod independence;     Vision       Perception     Praxis      Cognition Arousal/Alertness:  Awake/alert Behavior During Therapy: WFL for tasks assessed/performed Overall Cognitive Status: Within Functional Limits for tasks assessed                                          Exercises     Shoulder Instructions       General Comments e-translator used during session;pt's significant other present during session;VSS during session;educated pt on RUE HEP to maintain ROM and strength;educated pt to do HEP 3x/day      Pertinent Vitals/ Pain       Pain Assessment: Faces Faces Pain Scale: Hurts a little bit Pain Location: chest  Pain Descriptors / Indicators: Aching;Sore Pain Intervention(s): Limited activity within patient's tolerance;Monitored during session  Home Living                                          Prior Functioning/Environment              Frequency  Min 2X/week  Progress Toward Goals  OT Goals(current goals can now be found in the care plan section)  Progress towards OT goals: (Current level of function adequate for d/c)  Acute Rehab OT Goals Patient Stated Goal: to go home OT Goal Formulation: With patient Time For Goal Achievement: 08/08/18 Potential to Achieve Goals: Good ADL Goals Pt Will Perform Grooming: Independently Pt Will Transfer to Toilet: Independently Pt Will Perform Tub/Shower Transfer: Independently;ambulating;Tub transfer  Plan Discharge plan remains appropriate    Co-evaluation                 AM-PAC PT "6 Clicks" Daily Activity     Outcome Measure   Help from another person eating meals?: None Help from another person taking care of personal grooming?: None Help from another person toileting, which includes using toliet, bedpan, or urinal?: None Help from another person bathing (including washing, rinsing, drying)?: None Help from another person to put on and taking off regular upper body clothing?: None Help from another person to put on and taking off regular lower  body clothing?: None 6 Click Score: 24    End of Session Equipment Utilized During Treatment: Gait belt  OT Visit Diagnosis: Unsteadiness on feet (R26.81);Muscle weakness (generalized) (M62.81);Other abnormalities of gait and mobility (R26.89)   Activity Tolerance Patient tolerated treatment well   Patient Left in bed;with call bell/phone within reach;with family/visitor present   Nurse Communication Mobility status        Time: 1540-1559 OT Time Calculation (min): 19 min  Charges:   Diona Browner OTS     Diona Browner 07/26/2018, 4:32 PM

## 2018-07-26 NOTE — Discharge Instructions (Signed)
1. PAIN CONTROL:  1. Pain is best controlled by a usual combination of three different methods TOGETHER:  1. Ice/Heat 2. Over the counter pain medication 3. Prescription pain medication 2. Most patients will experience some swelling and bruising around wounds. Ice packs or heating pads (30-60 minutes up to 6 times a day) will help. Use ice for the first few days to help decrease swelling and bruising, then switch to heat to help relax tight/sore spots and speed recovery. Some people prefer to use ice alone, heat alone, alternating between ice & heat. Experiment to what works for you. Swelling and bruising can take several weeks to resolve.  3. It is helpful to take an over-the-counter pain medication regularly for the first few weeks. Choose one of the following that works best for you:  1. Naproxen (Aleve, etc) Two 220mg  tabs twice a day 2. Ibuprofen (Advil, etc) Three 200mg  tabs four times a day (every meal & bedtime) 3. Acetaminophen (Tylenol, etc) 500-650mg  four times a day (every meal & bedtime) 4. A prescription for pain medication (such as oxycodone, hydrocodone, etc) should be given to you upon discharge. Take your pain medication as prescribed.  1. If you are having problems/concerns with the prescription medicine (does not control pain, nausea, vomiting, rash, itching, etc), please call us [phone] to see if we need to switch you to a different pain medicine that will work better for you and/or control your side effect better. 2. If you need a refill on your pain medication, please contact your pharmacy. They will contact our office to request authorization. Prescriptions will not be filled after 5 pm or on week-ends. 4. Avoid getting constipated. When taking pain medications, it is common to experience some constipation. Increasing fluid intake and taking a fiber supplement (such as Metamucil, Citrucel, FiberCon, MiraLax, etc) 1-2 times a day regularly will usually help prevent this  problem from occurring. A mild laxative (prune juice, Milk of Magnesia, MiraLax, etc) should be taken according to package directions if there are no bowel movements after 48 hours.  5. Watch out for diarrhea. If you have many loose bowel movements, simplify your diet to bland foods & liquids for a few days. Stop any stool softeners and decrease your fiber supplement. Switching to mild anti-diarrheal medications (Kayopectate, Pepto Bismol) can help. If this worsens or does not improve, please call us. 6. Wash / shower  You may remove the bandage on your chest and shower in 3 days. No bathing or submerging your wounds in water until they heal. 7. FOLLOW UP in our office  Please call [facility] at [phone] to set up an appointment for a follow-up appointment approximately 2-3 weeks after discharge   WHEN TO CALL US [phone]:  1. Poor pain control 2. Reactions / problems with new medications (rash/itching, nausea, etc)  3. Fever over 101.5 F (38.5 C) 4. Worsening swelling or bruising 5. Continued bleeding from wounds. 6. Increased pain, redness, or drainage from the wounds which could be signs of infection  The clinic staff is available to answer your questions during regular business hours (8:30am-5pm). Please dont hesitate to call and ask to speak to one of our nurses for clinical concerns.  If you have a medical emergency, go to the nearest emergency room or call 911.  A surgeon from [facility] is always on call at the [facility]   [facility] [place]  [address], [address], [place], [place] [address] ?  MAIN: [phone] ? TOLL FREE: [phone] ?  FAX [phone]  [email or web address]   Rib Fracture A rib fracture is a break or crack in one of the bones of the ribs. The ribs are a group of long, curved bones that wrap around your chest and attach to your spine. They protect your lungs and other organs in the chest cavity. A broken or  cracked rib is often painful, but most do not cause other problems. Most rib fractures heal on their own over time. However, rib fractures can be more serious if multiple ribs are broken or if broken ribs move out of place and push against other structures. What are the causes?  A direct blow to the chest. For example, this could happen during contact sports, a car accident, or a fall against a hard object.  Repetitive movements with high force, such as pitching a baseball or having severe coughing spells. What are the signs or symptoms?  Pain when you breathe in or cough.  Pain when someone presses on the injured area. How is this diagnosed? Your caregiver will perform a physical exam. Various imaging tests may be ordered to confirm the diagnosis and to look for related injuries. These tests may include a chest X-ray, computed tomography (CT), magnetic resonance imaging (MRI), or a bone scan. How is this treated? Rib fractures usually heal on their own in 1-3 months. The longer healing period is often associated with a continued cough or other aggravating activities. During the healing period, pain control is very important. Medication is usually given to control pain. Hospitalization or surgery may be needed for more severe injuries, such as those in which multiple ribs are broken or the ribs have moved out of place. Follow these instructions at home:  Avoid strenuous activity and any activities or movements that cause pain. Be careful during activities and avoid bumping the injured rib.  Gradually increase activity as directed by your caregiver.  Only take over-the-counter or prescription medications as directed by your caregiver. Do not take other medications without asking your caregiver first.  Apply ice to the injured area for the first 1-2 days after you have been treated or as directed by your caregiver. Applying ice helps to reduce inflammation and pain. ? Put ice in a plastic  bag. ? Place a towel between your skin and the bag. ? Leave the ice on for 15-20 minutes at a time, every 2 hours while you are awake.  Perform deep breathing as directed by your caregiver. This will help prevent pneumonia, which is a common complication of a broken rib. Your caregiver may instruct you to: ? Take deep breaths several times a day. ? Try to cough several times a day, holding a pillow against the injured area. ? Use a device called an incentive spirometer to practice deep breathing several times a day.  Drink enough fluids to keep your urine clear or pale yellow. This will help you avoid constipation.  Do not wear a rib belt or binder. These restrict breathing, which can lead to pneumonia. Get help right away if:  You have a fever.  You have difficulty breathing or shortness of breath.  You develop a continual cough, or you cough up thick or bloody sputum.  You feel sick to your stomach (nausea), throw up (vomit), or have abdominal pain.  You have worsening pain not controlled with medications. This information is not intended to replace advice given to you by your health care provider.  Make sure you discuss any questions you have with your health care provider. Document Released: 12/13/2005 Document Revised: 05/20/2016 Document Reviewed: 02/14/2013 Elsevier Interactive Patient Education  [employer].

## 2018-07-26 NOTE — Progress Notes (Signed)
Pt seen by MD, orders written for d/c.  Went over discharge instructions with pt and wife, answered all questions.  Removed IV's and telemetry, no complications.  New prescriptions given to pt for Tramadol & Oxy. Escorted for discharge via wheelchair with all belongings.  Will follow up outpatient with MD.

## 2018-07-26 NOTE — Discharge Summary (Signed)
[facility] Discharge Summary   Patient ID: George Wyatt MRN: [id number] DOB/AGE: 07-15-1976 42 y.o.  Admit date: 07/23/2018 Discharge date: 07/26/2018  Admitting Diagnosis: Fall Right rib fractures 1-4 Small right ptx Pelvic fx (right puboacetabular jxn)  Discharge Diagnosis Patient Active Problem List   Diagnosis Date Noted  . Fall 07/23/2018  . Acute appendicitis 08/31/2017    [facility] Orthoped[facility]   Imaging: Dg Chest Port 1 View  Result Date: 07/26/2018 CLINICAL DATA:  Patient has a history of a pneumothorax following a fall from a ladder. This has been treated with a chest tube. Now the chest tube is been removed. EXAM: PORTABLE CHEST 1 VIEW COMPARISON:  Portable chest x-ray of earlier today. FINDINGS: There remains mild volume loss on the right. No reaccumulation of a pneumothorax is observed. The lung markings remain mildly increased in the right infrahilar region. The left lung is better inflated and clear. The heart is top-normal in size. The pulmonary vascularity is normal. There is calcification in the wall of the aortic arch. The observed bony thorax is unremarkable. IMPRESSION: No residual pneumothorax on the right following removal of the right chest tube. Minimal right basilar atelectasis persists. Thoracic aortic atherosclerosis. Electronically Signed   By: David  SwazilandJordan M.D.   On: 07/26/2018 13:24   Dg Chest Port 1 View  Result Date: 07/26/2018 CLINICAL DATA:  Right chest tube EXAM: PORTABLE CHEST 1 VIEW COMPARISON:  07/25/2018 FINDINGS: Right lateral chest tube remains in place. No visible pneumothorax. Right base atelectasis. Heart is borderline in size. No confluent opacity on the left. IMPRESSION: Right chest tube remains in place without pneumothorax. Right base atelectasis. Electronically Signed   By: Charlett NoseKevin  Dover M.D.   On: 07/26/2018 07:36   Dg Chest Port 1 View  Result Date: 07/25/2018 CLINICAL DATA:  42 year old male with right rib  fractures and pneumothorax after fall from roof. Right chest tube placed yesterday. EXAM: PORTABLE CHEST 1 VIEW COMPARISON:  07/24/2018 and earlier. FINDINGS: Portable AP upright view at 0653 hours. Stable pigtail type right chest tube. No residual pneumothorax identified. Continued low lung volumes. Patchy opacity at the right lung base most resembling atelectasis yesterday has regressed. No new opacity. Stable cardiac size and mediastinal contours. Visualized tracheal air column is within normal limits. Negative visible bowel gas pattern. Right rib fractures better demonstrated by CT. IMPRESSION: 1. Stable right chest tube with no residual pneumothorax identified. 2. Improved right lung base atelectasis. No new cardiopulmonary abnormality. Electronically Signed   By: Odessa FlemingH  Hall M.D.   On: 07/25/2018 09:24   Dg Chest Port 1 View  Result Date: 07/24/2018 CLINICAL DATA:  Follow-up pneumothorax following chest tube placement EXAM: PORTABLE CHEST 1 VIEW COMPARISON:  Film from earlier in the same day. FINDINGS: Cardiac shadow is stable. Pigtail catheter is now seen on the right with resolution of the previously seen pneumothorax. Mild right basilar atelectasis is noted. The left lung remains clear. No bony abnormality is seen. IMPRESSION: Resolution of previously seen right pneumothorax. Mild right basilar atelectasis is noted. Electronically Signed   By: Alcide CleverMark  Lukens M.D.   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  He landed on his right side and felt short of breath.  He came in as a level 2 trauma.  He states that his whole right side hurts.  He has a history of a prior injury on his right lower leg requiring multiple surgeries.  He denies abdominal pain, nausea or vomiting.    Hospital Course:  Workup showed R rib fractures 1-4, R pneumothorax (PTX), pelvic  fracture.   Patient was admitted and underwent procedure listed above.  Tolerated procedure well. Orthopedics saw pt for his pelvic fracture and recommended non op treatment and weight bearing as tolerated. PTX resolved and CT was removed on 07/31. Pt worked with therapies. On 07/31, repeat chest xray after CT removal showed no PTX. The patient was voiding well, tolerating diet, ambulating well, pain well controlled, vital signs stable, and felt stable for discharge home.  Patient will follow up as outlined below and knows to call with questions or concerns.  Patient was discharged in good condition.  The [place] Substance controlled database was reviewed prior to prescribing narcotic pain medication to this patient.  Physical Exam: Gen:  Alert, NAD, pleasant, cooperative Chest: chest tube removed and dressing applied Card:  RRR, no M/G/R heard Pulm:  CTA, no W/R/R, effort normal Abd: Soft, NT/ND, +BS Skin: no rashes noted, warm and dry  Allergies as of 07/26/2018   No Known Allergies     Medication List    STOP taking these medications   HYDROcodone-acetaminophen 5-325 MG tablet Commonly known as:  NORCO/VICODIN     TAKE these medications   oxyCODONE 5 MG immediate release tablet Commonly known as:  Oxy IR/ROXICODONE Take 1 tablet (5 mg total) by mouth every 6 (six) hours as needed for moderate pain.   traMADol 50 MG tablet Commonly known as:  ULTRAM Take 1 tablet (50 mg total) by mouth every 6 (six) hours as needed for severe pain.        Follow-up Information    Yolonda Kida, MD. Schedule an appointment as soon as possible for a visit in 2 week(s).   Specialty:  Orthopedic Surgery Why:  for follow up regarding pelvic fracture Contact information: [address] [place] [address] [phone]        [facility]. Go on 08/07/2018.   Why:  for a chest xray. No appointment needed.  Contact information: [address] [place] [employer], [place] [phone]       [facility] Follow up on 08/08/2018.   Why:  at 9:30am. Please arrive 15 minutes prior to complete paperwork. Please be sure to get chest xray on 08/12 at [facility] information: [address] [place] [address] [phone]          Signed: Joyce Copa [facility] 07/26/2018, 3:06 PM Pager: [phone] Consults: [phone] Mon-Fri 7:00 am-4:30 pm Sat-Sun 7:00 am-11:30 am

## 2018-07-26 NOTE — Progress Notes (Signed)
[facility] Progress Note      Assessment/Plan Fall Right rib fractures 1-4- pulm toilet and pain control Small right ptx- CT placed 07/29, removed 07/31 today Pelvic fx (right puboacetabular jxn)- per Dr. Nila NephewogersWBAT, f/u in 2-3 weeks.  Right elbow pain- films negative, pain control Elevated T bili- unclear etiology. CT negative for hepatobiliary findings.  FEN -regular diet VTE -Lovenox/SCDs ID- None Foley: none Follow up: trauma clinic 2 weeks with CXR  DISPO: repeat CXR at 1300, if no PTX can be discharged home, encourage IS and ambulation. Scheduled tramadol    LOS: 3 days    Subjective: CC: rib pain  Pt has no new complaints. 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 Pulling 1000cc on IS.   Objective: Vital signs in last 24 hours: Temp:  [97.7 F (36.5 C)-98.3 F (36.8 C)] 98 F (36.7 C) (07/31 0815) Pulse Rate:  [69-85] 82 (07/31 0815) Resp:  [17-24] 19 (07/31 0815) BP: (107-128)/(74-95) 126/95 (07/31 0815) SpO2:  [94 %-99 %] 97 % (07/31 0815) Last BM Date: 07/23/18  Intake/Output from previous day: 07/30 0701 - 07/31 0700 In: 1745.9 [P.O.:480; I.V.:1265.9] Out: 2675 [Urine:2575; Chest Tube:100] Intake/Output this shift: No intake/output data recorded.  PE: Gen:  Alert, NAD, pleasant, cooperative Chest: chest tube removed and dressing applied Card:  RRR, no M/G/R heard Pulm:  CTA, no W/R/R, effort normal Abd: Soft, NT/ND, +BS Skin: no rashes noted, warm and dry   Anti-infectives: Anti-infectives (From admission, onward)   None      Lab Results:  Recent Labs    07/24/18 0432 07/26/18 0255  WBC 7.5 7.6  HGB 13.8 13.2  HCT 43.4 40.6  PLT 281 274   BMET Recent Labs    07/25/18 0348 07/26/18 0255  NA 139 139  K 4.5 3.8  CL 106 107  CO2 28 25  GLUCOSE 109* 107*  BUN 11 8  CREATININE 1.08 0.85  CALCIUM 8.6* 8.3*   PT/INR No results for input(s): LABPROT, INR in the last 72 hours. CMP     Component  Value Date/Time   NA 139 07/26/2018 0255   K 3.8 07/26/2018 0255   CL 107 07/26/2018 0255   CO2 25 07/26/2018 0255   GLUCOSE 107 (H) 07/26/2018 0255   BUN 8 07/26/2018 0255   CREATININE 0.85 07/26/2018 0255   CALCIUM 8.3 (L) 07/26/2018 0255   PROT 8.4 (H) 07/23/2018 1403   ALBUMIN 4.7 07/23/2018 1403   AST 36 07/23/2018 1403   ALT 32 07/23/2018 1403   ALKPHOS 97 07/23/2018 1403   BILITOT 2.9 (H) 07/23/2018 1403   GFRNONAA >60 07/26/2018 0255   GFRAA >60 07/26/2018 0255   Lipase     Component Value Date/Time   LIPASE 33 07/23/2018 1403    Studies/Results: Dg Chest Port 1 View  Result Date: 07/26/2018 CLINICAL DATA:  Right chest tube EXAM: PORTABLE CHEST 1 VIEW COMPARISON:  07/25/2018 FINDINGS: Right lateral chest tube remains in place. No visible pneumothorax. Right base atelectasis. Heart is borderline in size. No confluent opacity on the left. IMPRESSION: Right chest tube remains in place without pneumothorax. Right base atelectasis. Electronically Signed   By: Charlett NoseKevin  Dover M.D.   On: 07/26/2018 07:36   Dg Chest Port 1 View  Result Date: 07/25/2018 CLINICAL DATA:  42 year old male with right rib fractures and pneumothorax after fall from roof. Right chest tube placed yesterday. EXAM: PORTABLE CHEST 1 VIEW COMPARISON:  07/24/2018 and earlier. FINDINGS: Portable AP upright view at 0653 hours.  Stable pigtail type right chest tube. No residual pneumothorax identified. Continued low lung volumes. Patchy opacity at the right lung base most resembling atelectasis yesterday has regressed. No new opacity. Stable cardiac size and mediastinal contours. Visualized tracheal air column is within normal limits. Negative visible bowel gas pattern. Right rib fractures better demonstrated by CT. IMPRESSION: 1. Stable right chest tube with no residual pneumothorax identified. 2. Improved right lung base atelectasis. No new cardiopulmonary abnormality. Electronically Signed   By: Odessa Fleming M.D.   On:  07/25/2018 09:24   Dg Chest Port 1 View  Result Date: 07/24/2018 CLINICAL DATA:  Follow-up pneumothorax following chest tube placement EXAM: PORTABLE CHEST 1 VIEW COMPARISON:  Film from earlier in the same day. FINDINGS: Cardiac shadow is stable. Pigtail catheter is now seen on the right with resolution of the previously seen pneumothorax. Mild right basilar atelectasis is noted. The left lung remains clear. No bony abnormality is seen. IMPRESSION: Resolution of previously seen right pneumothorax. Mild right basilar atelectasis is noted. Electronically Signed   By: Alcide Clever M.D.   On: 07/24/2018 16:16      Jerre Simon , [facility] 07/26/2018, 9:19 AM  Pager: [phone] Mon-Wed, Friday 7:00am-4:30pm Thurs 7am-11:30am  Consults: [phone]

## 2018-08-07 ENCOUNTER — Ambulatory Visit
Admission: RE | Admit: 2018-08-07 | Discharge: 2018-08-07 | Disposition: A | Discharge status: Home / Self Care | Payer: No Typology Code available for payment source | Source: Ambulatory Visit | Attending: General Surgery | Admitting: General Surgery

## 2018-08-07 ENCOUNTER — Other Ambulatory Visit: Payer: Self-pay | Admitting: General Surgery

## 2018-08-07 DIAGNOSIS — J939 Pneumothorax, unspecified: Secondary | ICD-10-CM

## 2019-07-07 IMAGING — CT CT CERVICAL SPINE W/O CM
5 of 8 series · 12 of 33 positions shown, 13 images · non-contrast
Comparison: None.

CLINICAL DATA: Fall from roof.  Neck and back pain

EXAM:
CT HEAD WITHOUT CONTRAST
CT CERVICAL SPINE WITHOUT CONTRAST
TECHNIQUE: Multidetector CT imaging of the head and cervical spine was
performed following the standard protocol without intravenous
contrast. Multiplanar CT image reconstructions of the cervical spine
were also generated.

[Series 7: head bone · axial · 0.44mm/px · z∈[+400,+452]mm · 2 of 80 slices shown]
[im 27/80  bone]
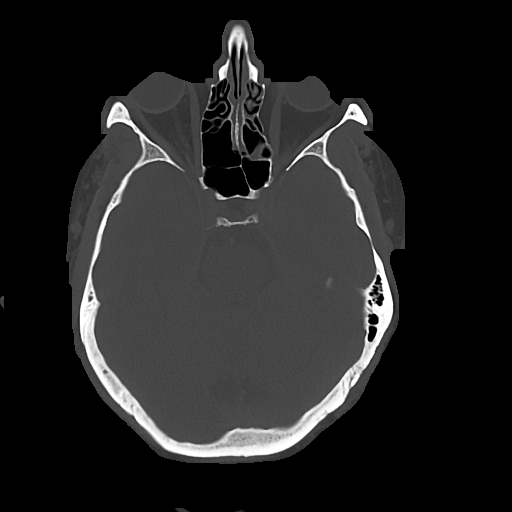
[im 53/80  bone]
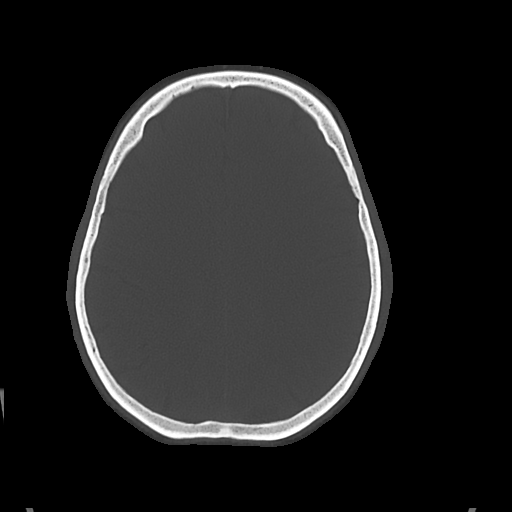

[Series 11: c spine soft · axial · 0.28mm/px · z∈[+259,+355]mm · 3 of 97 slices shown]
[im 25/97  soft-tissue]
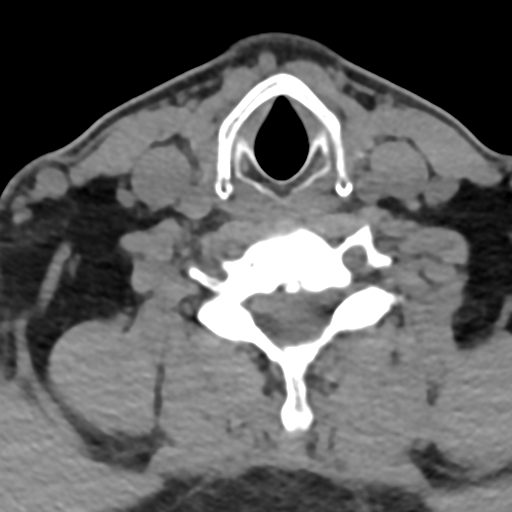
[im 49/97  soft-tissue]
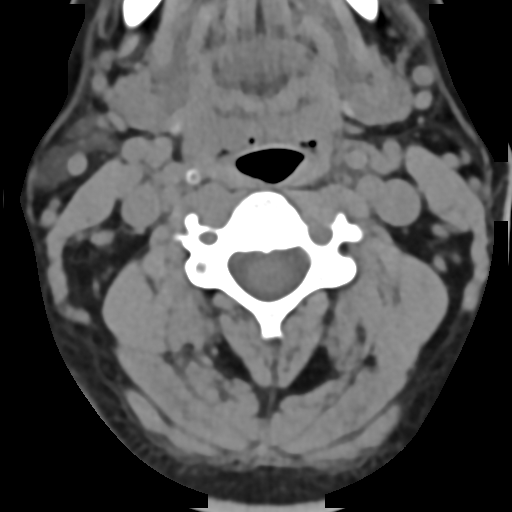
[im 73/97  soft-tissue]
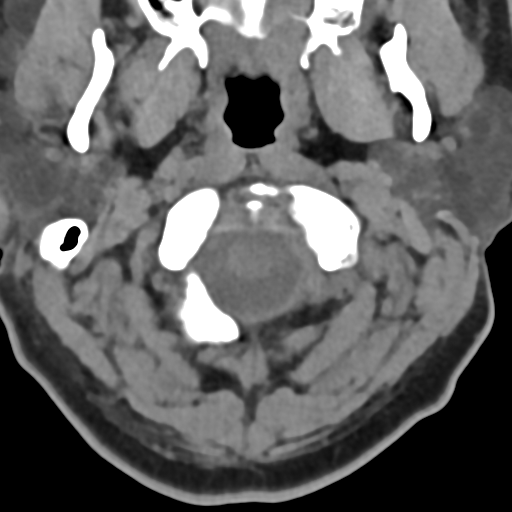

[Series 12: sag bone · sagittal · 0.33mm/px · 4 of 53 slices shown]
[im 11/53  bone]
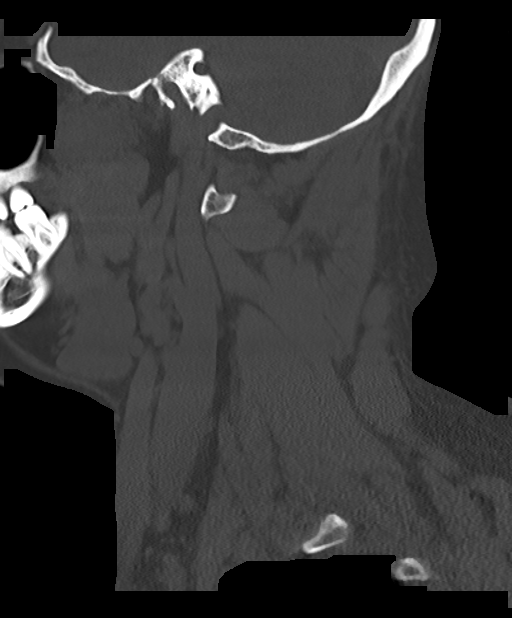
[im 21/53  bone]
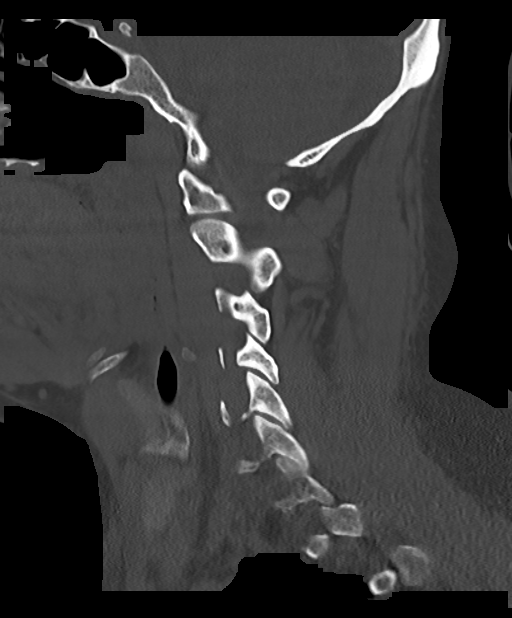
[im 32/53  bone]
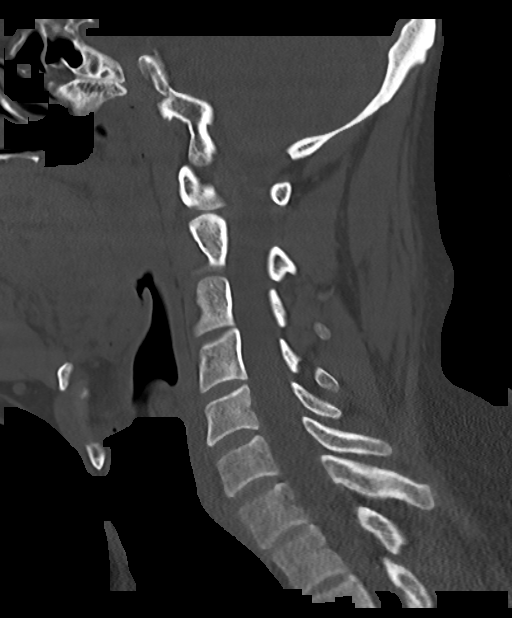
[im 42/53  bone]
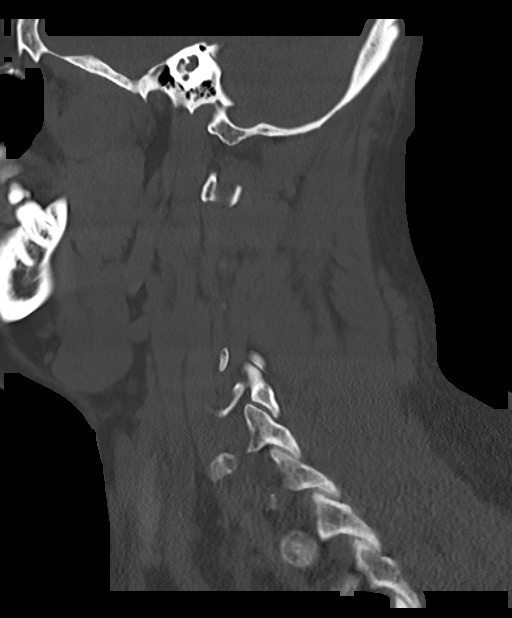

[Series 13: cor bone · coronal · 0.32mm/px · 1 of 60 slices shown]
[im 30/60  bone]
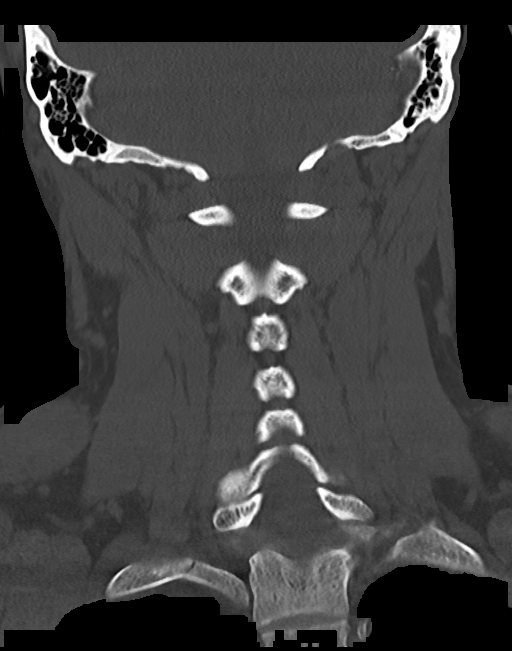

[Series 14: orthogonal axials · axial · 0.21mm/px · z∈[+248,+303]mm · 2 of 83 slices shown, 3 images]
[im 28/83  soft-tissue]
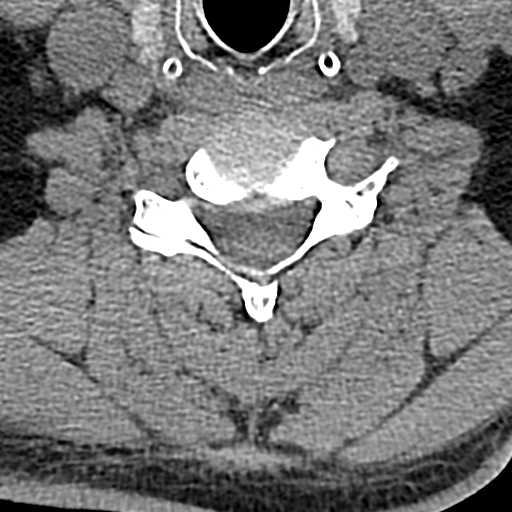
[im 28/83  bone]
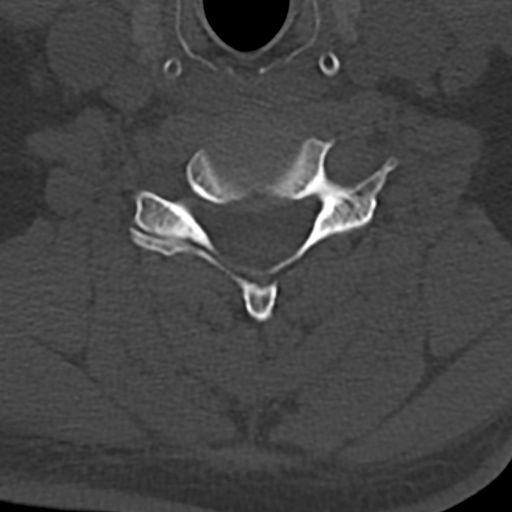
[im 55/83  bone]
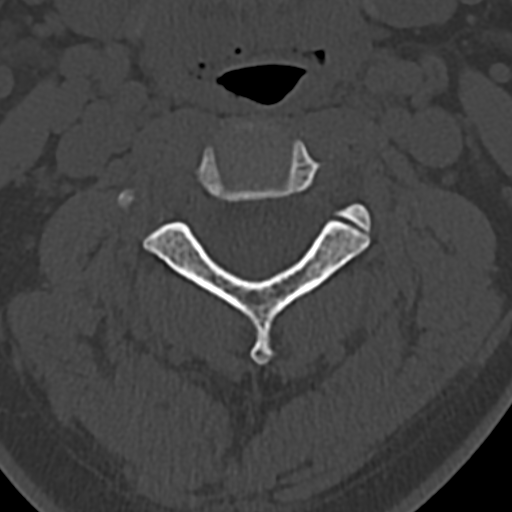

[12 of 33 positions shown; findings below may reference images not displayed]

FINDINGS: CT HEAD FINDINGS

Brain: No acute intracranial abnormality. Specifically, no
hemorrhage, hydrocephalus, mass lesion, acute infarction, or
significant intracranial injury.

Vascular: No hyperdense vessel or unexpected calcification.

Skull: No acute calvarial abnormality.

Sinuses/Orbits: Vessel thickening within the ethmoid air cells. No
air-fluid levels. Mastoid air cells clear. Orbital soft tissues
unremarkable.

Other: Probable old left medial orbital wall blowout fracture.

CT CERVICAL SPINE FINDINGS

Alignment: Normal

Skull base and vertebrae: No acute fracture. No primary bone lesion
or focal pathologic process.

Soft tissues and spinal canal: No prevertebral fluid or swelling. No
visible canal hematoma.

Disc levels:  Maintains

Upper chest: Pneumothorax noted in the right apex.

Other: No acute findings
IMPRESSION: No intracranial abnormality.

No acute bony abnormality in cervical spine.

Small right apical pneumothorax. See further discussion in the chest
CT report.

## 2019-07-08 IMAGING — DX DG CHEST 1V PORT
1 series · 1 of 1 positions shown · non-contrast
Comparison: Film from earlier in the same day.

CLINICAL DATA: Follow-up pneumothorax following chest tube
placement

EXAM:
PORTABLE CHEST 1 VIEW

[chest ap]
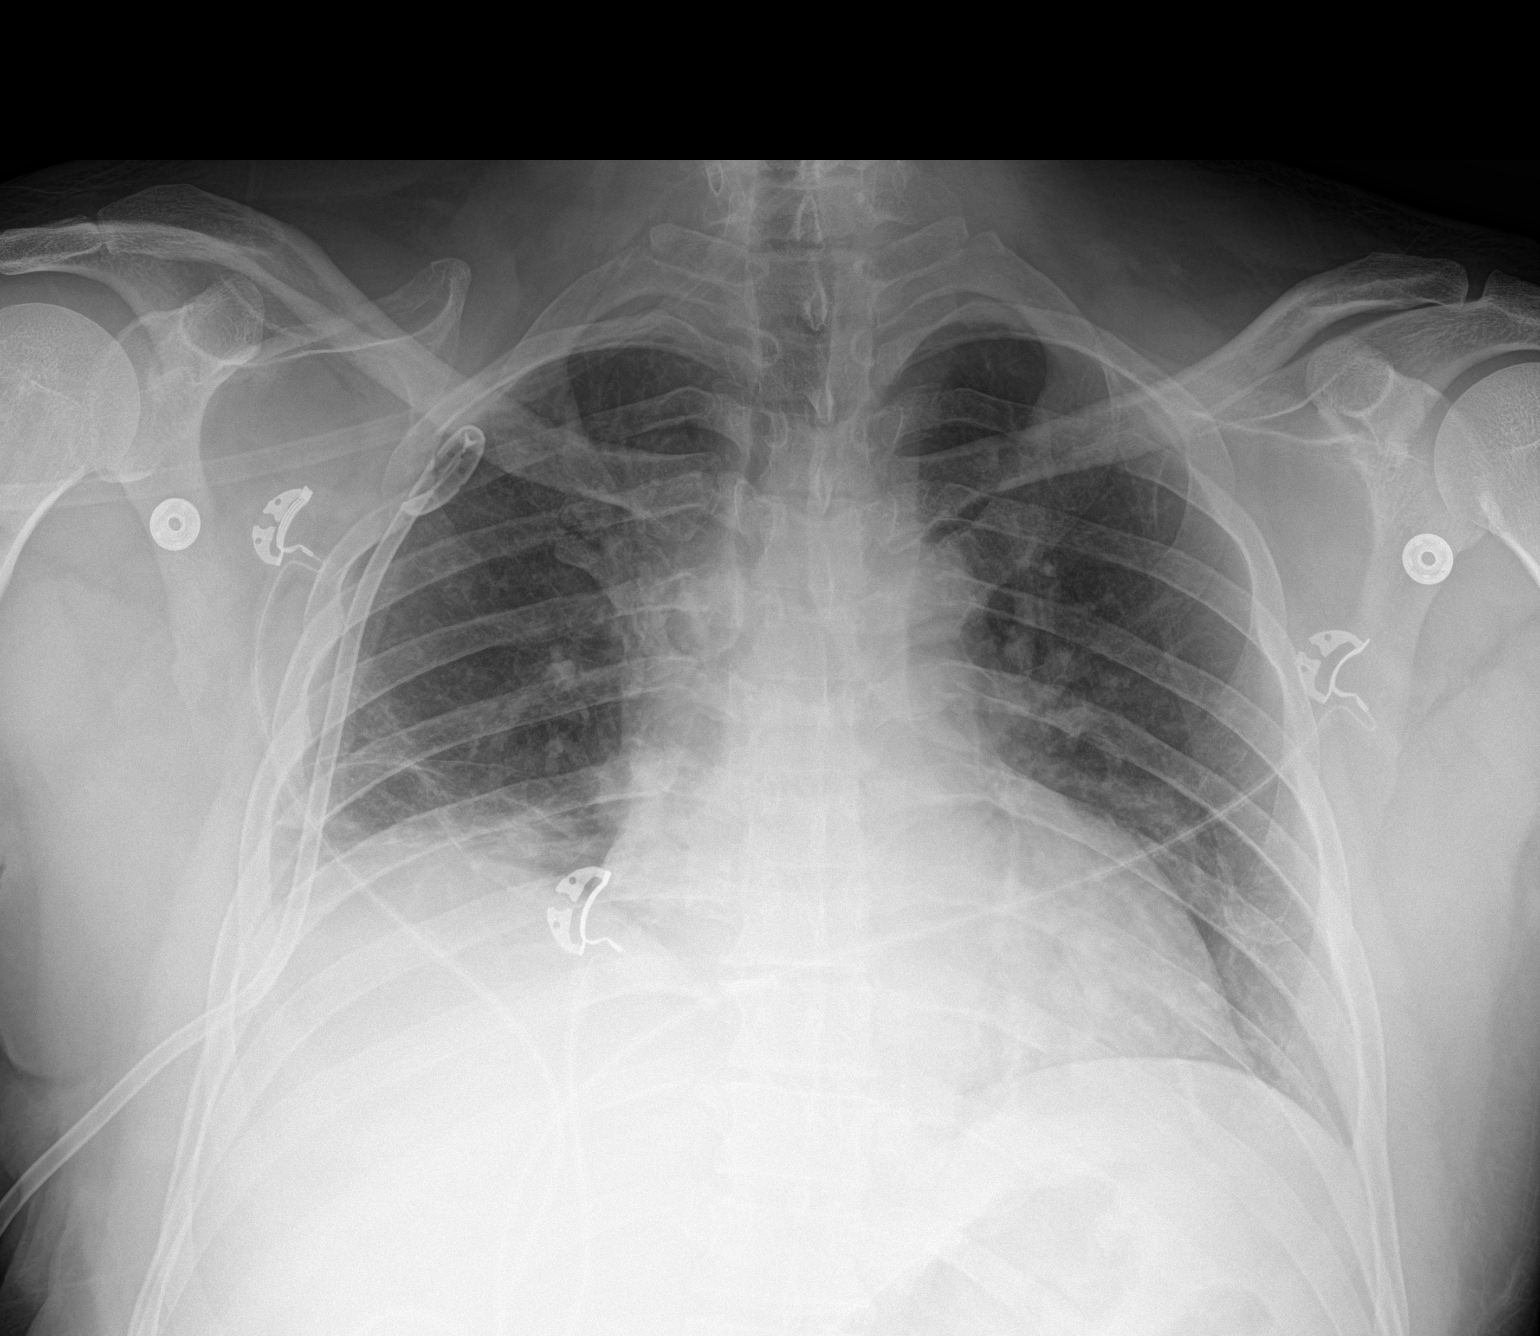

[1 of 1 positions shown; findings below may reference images not displayed]

FINDINGS: Cardiac shadow is stable. Pigtail catheter is now seen on the right
with resolution of the previously seen pneumothorax. Mild right
basilar atelectasis is noted. The left lung remains clear. No bony
abnormality is seen.
IMPRESSION: Resolution of previously seen right pneumothorax. Mild right basilar
atelectasis is noted.

## 2019-07-09 IMAGING — DX DG CHEST 1V PORT
1 series · 1 of 1 positions shown · non-contrast
Comparison: 07/24/2018 and earlier.

CLINICAL DATA: 42-year-old male with right rib fractures and
pneumothorax after fall from roof. Right chest tube placed
yesterday.

EXAM:
PORTABLE CHEST 1 VIEW

[chest ap]
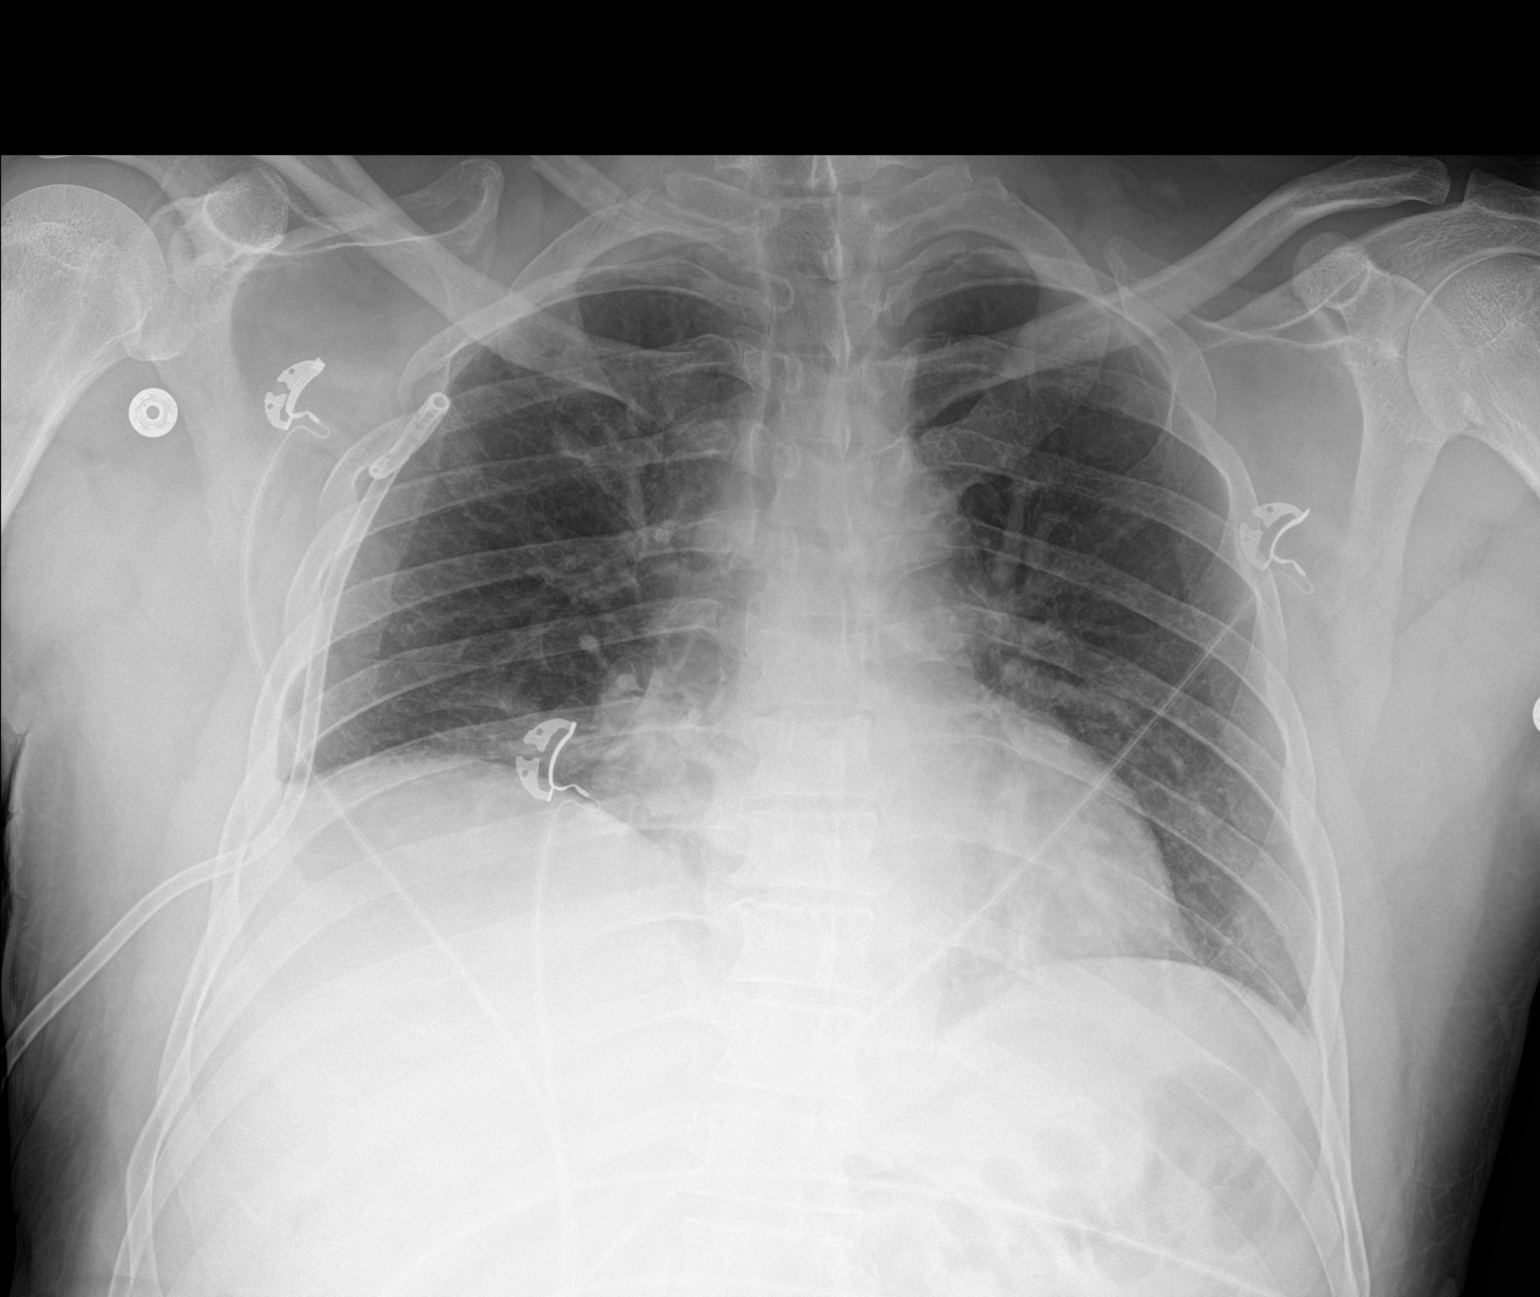

[1 of 1 positions shown; findings below may reference images not displayed]

FINDINGS: Portable AP upright view at 4780 hours. Stable pigtail type right
chest tube. No residual pneumothorax identified. Continued low lung
volumes. Patchy opacity at the right lung base most resembling
atelectasis yesterday has regressed. No new opacity. Stable cardiac
size and mediastinal contours. Visualized tracheal air column is
within normal limits. Negative visible bowel gas pattern. Right rib
fractures better demonstrated by CT.
IMPRESSION: 1. Stable right chest tube with no residual pneumothorax identified.
2. Improved right lung base atelectasis. No new cardiopulmonary
abnormality.

## 2019-07-10 IMAGING — DX DG CHEST 1V PORT
1 series · 1 of 1 positions shown · non-contrast
Comparison: 07/25/2018

CLINICAL DATA: Right chest tube

EXAM:
PORTABLE CHEST 1 VIEW

[chest ap]
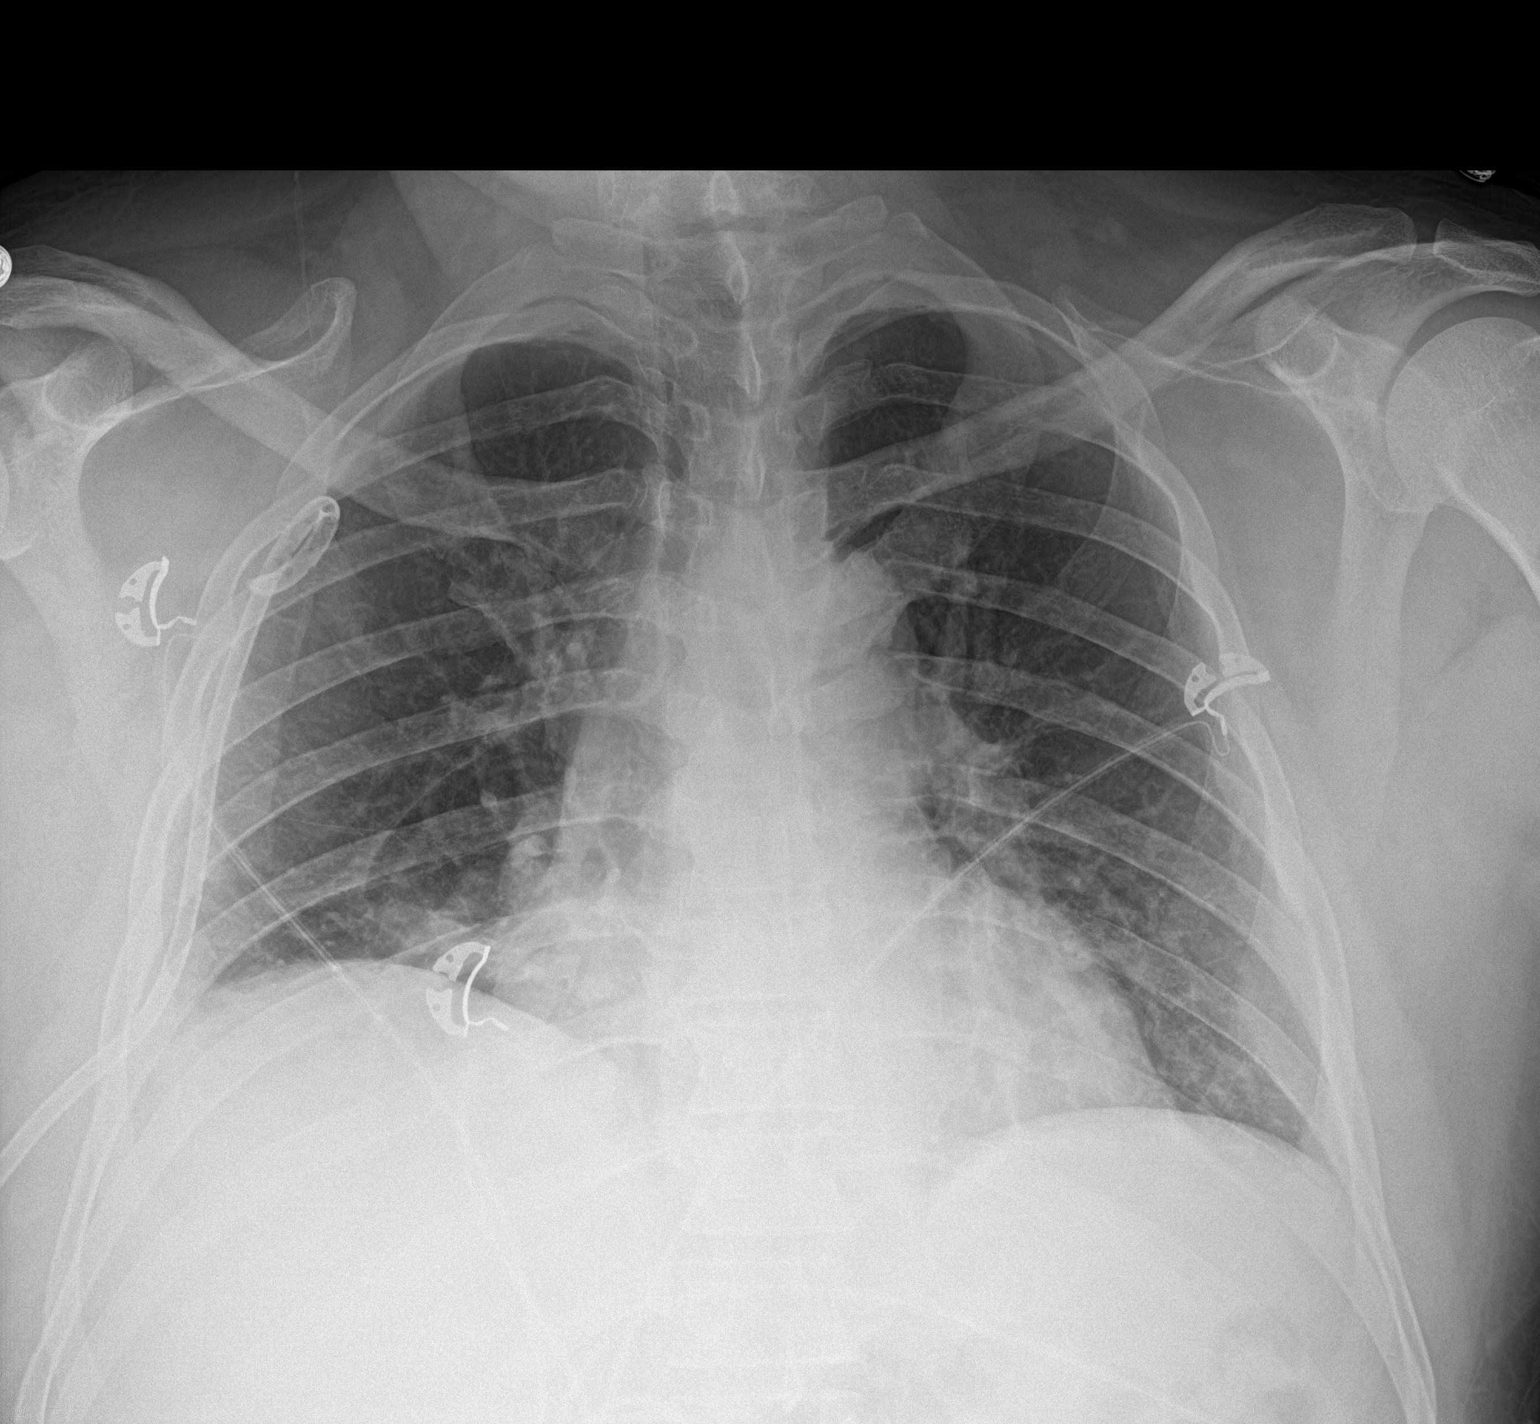

[1 of 1 positions shown; findings below may reference images not displayed]

FINDINGS: Right lateral chest tube remains in place. No visible pneumothorax.
Right base atelectasis. Heart is borderline in size. No confluent
opacity on the left.
IMPRESSION: Right chest tube remains in place without pneumothorax. Right base
atelectasis.

## 2019-07-10 IMAGING — DX DG CHEST 1V PORT
1 series · 1 of 1 positions shown · non-contrast
Comparison: Portable chest x-ray of earlier today.

CLINICAL DATA: Patient has a history of a pneumothorax following a
fall from a ladder. This has been treated with a chest tube. Now the
chest tube is been removed.

EXAM:
PORTABLE CHEST 1 VIEW

[chest ap]
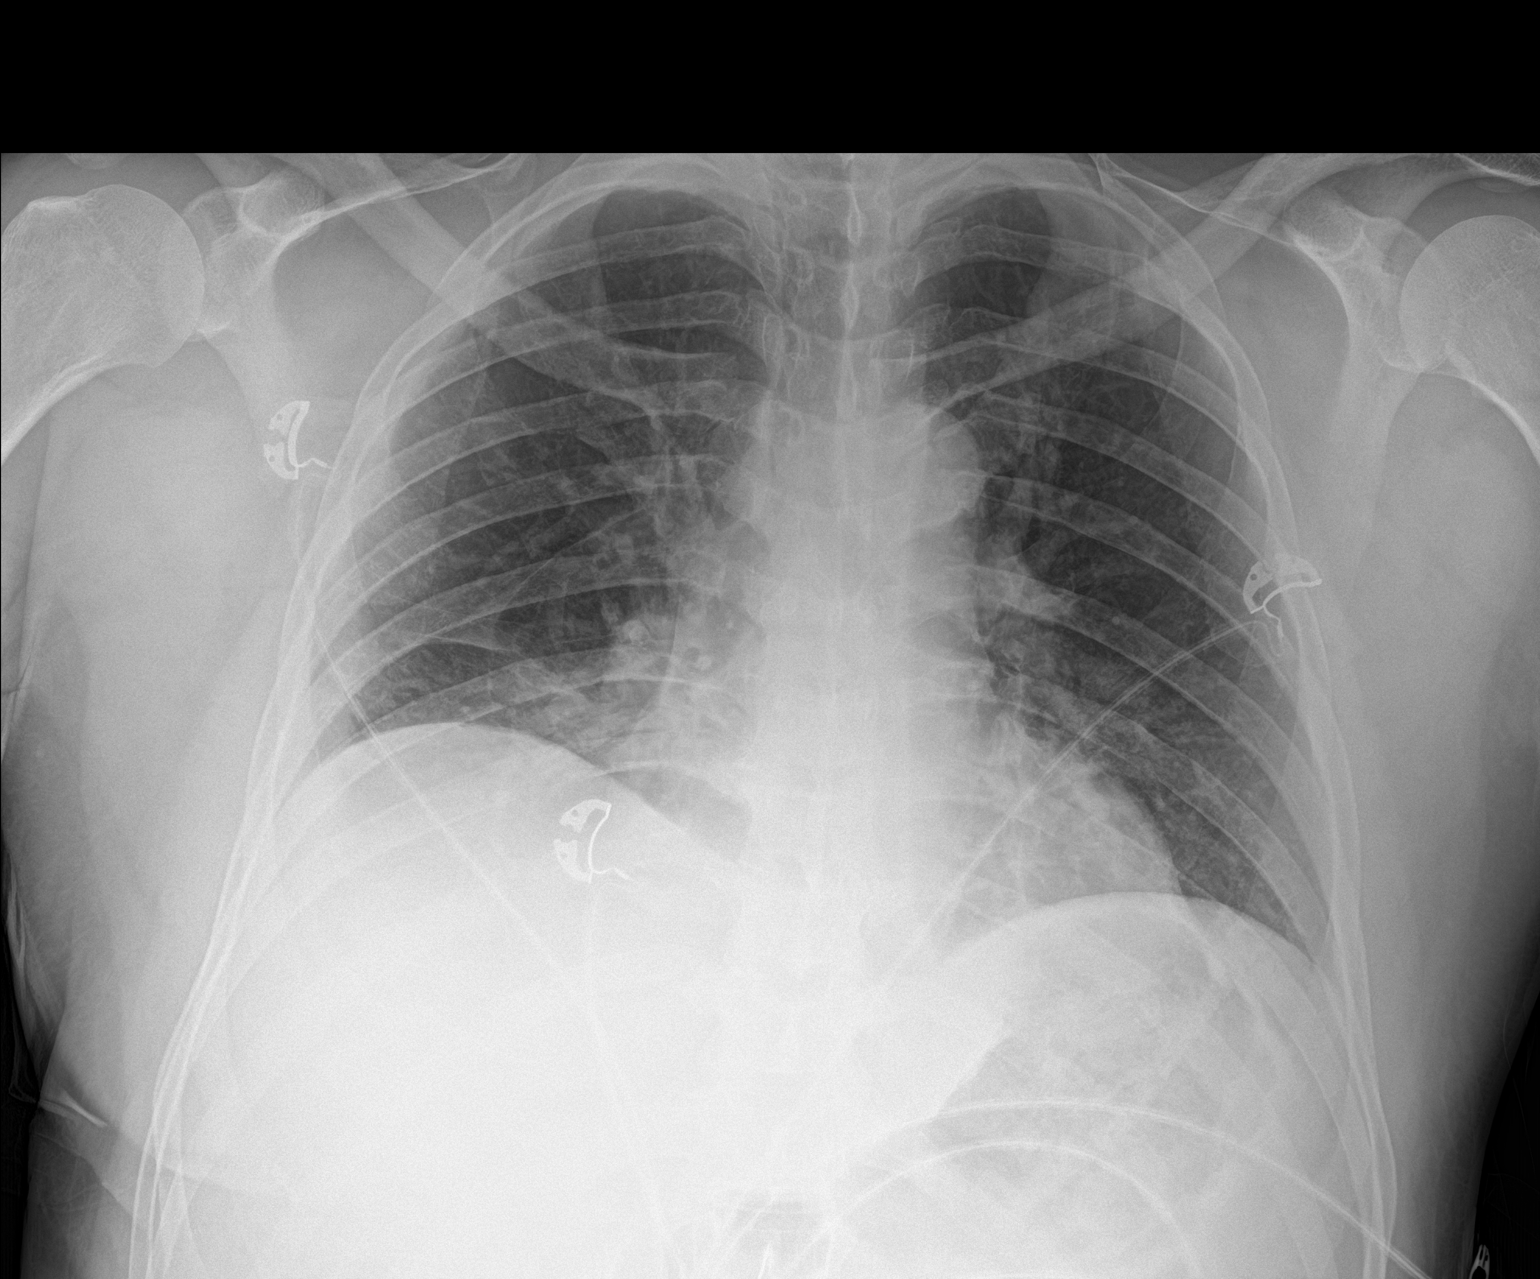

[1 of 1 positions shown; findings below may reference images not displayed]

FINDINGS: There remains mild volume loss on the right. No reaccumulation of a
pneumothorax is observed. The lung markings remain mildly increased
in the right infrahilar region. The left lung is better inflated and
clear. The heart is top-normal in size. The pulmonary vascularity is
normal. There is calcification in the wall of the aortic arch. The
observed bony thorax is unremarkable.
IMPRESSION: No residual pneumothorax on the right following removal of the right
chest tube. Minimal right basilar atelectasis persists.

Thoracic aortic atherosclerosis.
